# Patient Record
Sex: Female | Born: 1970 | Race: White | Hispanic: No | Marital: Married | Smoking: Never smoker
Health system: Southern US, Community
[De-identification: ages and names within clinical notes are randomized; demographics above are authoritative.]

## PROBLEM LIST (undated history)

## (undated) DIAGNOSIS — M1990 Unspecified osteoarthritis, unspecified site: Secondary | ICD-10-CM

## (undated) DIAGNOSIS — N943 Premenstrual tension syndrome: Secondary | ICD-10-CM

## (undated) DIAGNOSIS — A048 Other specified bacterial intestinal infections: Secondary | ICD-10-CM

## (undated) DIAGNOSIS — D1779 Benign lipomatous neoplasm of other sites: Secondary | ICD-10-CM

## (undated) DIAGNOSIS — F419 Anxiety disorder, unspecified: Secondary | ICD-10-CM

## (undated) DIAGNOSIS — R030 Elevated blood-pressure reading, without diagnosis of hypertension: Secondary | ICD-10-CM

## (undated) DIAGNOSIS — R42 Dizziness and giddiness: Secondary | ICD-10-CM

## (undated) DIAGNOSIS — K219 Gastro-esophageal reflux disease without esophagitis: Secondary | ICD-10-CM

## (undated) DIAGNOSIS — R7303 Prediabetes: Secondary | ICD-10-CM

## (undated) HISTORY — PX: WISDOM TOOTH EXTRACTION: SHX21

---
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
## 2000-06-13 HISTORY — PX: LAPAROTOMY: SHX154

## 2003-02-19 ENCOUNTER — Other Ambulatory Visit: Admission: RE | Admit: 2003-02-19 | Discharge: 2003-02-19 | Payer: Self-pay | Admitting: Obstetrics and Gynecology

## 2003-09-25 ENCOUNTER — Inpatient Hospital Stay (HOSPITAL_COMMUNITY): Admission: AD | Admit: 2003-09-25 | Discharge: 2003-09-29 | Payer: Self-pay | Admitting: Obstetrics and Gynecology

## 2009-06-13 HISTORY — PX: TUMOR EXCISION: SHX421

## 2010-10-27 ENCOUNTER — Ambulatory Visit: Payer: Self-pay | Admitting: Unknown Physician Specialty

## 2010-11-19 ENCOUNTER — Ambulatory Visit: Payer: Self-pay | Admitting: Unknown Physician Specialty

## 2013-12-23 ENCOUNTER — Ambulatory Visit: Payer: Self-pay | Admitting: Obstetrics and Gynecology

## 2013-12-25 ENCOUNTER — Ambulatory Visit: Payer: Self-pay | Admitting: Obstetrics and Gynecology

## 2014-06-13 DIAGNOSIS — A048 Other specified bacterial intestinal infections: Secondary | ICD-10-CM

## 2014-06-13 HISTORY — DX: Other specified bacterial intestinal infections: A04.8

## 2014-07-18 ENCOUNTER — Ambulatory Visit: Payer: Self-pay | Admitting: Obstetrics and Gynecology

## 2014-09-02 ENCOUNTER — Ambulatory Visit: Payer: Self-pay | Admitting: Family Medicine

## 2014-10-29 ENCOUNTER — Encounter: Payer: Self-pay | Admitting: *Deleted

## 2014-10-31 ENCOUNTER — Ambulatory Visit
Admission: RE | Admit: 2014-10-31 | Discharge: 2014-10-31 | Disposition: A | Discharge status: Home / Self Care | Payer: 59 | Source: Ambulatory Visit | Attending: Gastroenterology | Admitting: Gastroenterology

## 2014-10-31 ENCOUNTER — Ambulatory Visit: Payer: 59 | Admitting: Anesthesiology

## 2014-10-31 ENCOUNTER — Encounter
Admission: RE | Disposition: A | Discharge status: Home / Self Care | Payer: Self-pay | Source: Ambulatory Visit | Attending: Gastroenterology

## 2014-10-31 ENCOUNTER — Other Ambulatory Visit: Payer: Self-pay | Admitting: Gastroenterology

## 2014-10-31 DIAGNOSIS — R42 Dizziness and giddiness: Secondary | ICD-10-CM | POA: Diagnosis not present

## 2014-10-31 DIAGNOSIS — K2950 Unspecified chronic gastritis without bleeding: Secondary | ICD-10-CM | POA: Insufficient documentation

## 2014-10-31 DIAGNOSIS — Z882 Allergy status to sulfonamides status: Secondary | ICD-10-CM | POA: Insufficient documentation

## 2014-10-31 DIAGNOSIS — Z9889 Other specified postprocedural states: Secondary | ICD-10-CM | POA: Diagnosis not present

## 2014-10-31 DIAGNOSIS — F419 Anxiety disorder, unspecified: Secondary | ICD-10-CM | POA: Diagnosis not present

## 2014-10-31 DIAGNOSIS — Z79899 Other long term (current) drug therapy: Secondary | ICD-10-CM | POA: Diagnosis not present

## 2014-10-31 DIAGNOSIS — K219 Gastro-esophageal reflux disease without esophagitis: Secondary | ICD-10-CM | POA: Diagnosis present

## 2014-10-31 DIAGNOSIS — B9681 Helicobacter pylori [H. pylori] as the cause of diseases classified elsewhere: Secondary | ICD-10-CM | POA: Insufficient documentation

## 2014-10-31 HISTORY — PX: ESOPHAGOGASTRODUODENOSCOPY: SHX5428

## 2014-10-31 HISTORY — DX: Dizziness and giddiness: R42

## 2014-10-31 HISTORY — DX: Gastro-esophageal reflux disease without esophagitis: K21.9

## 2014-10-31 HISTORY — DX: Premenstrual tension syndrome: N94.3

## 2014-10-31 HISTORY — DX: Anxiety disorder, unspecified: F41.9

## 2014-10-31 SURGERY — EGD (ESOPHAGOGASTRODUODENOSCOPY)
Anesthesia: Monitor Anesthesia Care | Wound class: Clean Contaminated

## 2014-10-31 MED ORDER — LIDOCAINE HCL (CARDIAC) 20 MG/ML IV SOLN
INTRAVENOUS | Status: DC | PRN
  Administered 2014-10-31: 30 mg via INTRAVENOUS

## 2014-10-31 MED ORDER — LACTATED RINGERS IV SOLN
INTRAVENOUS | Status: DC
  Administered 2014-10-31 (×2): via INTRAVENOUS

## 2014-10-31 MED ORDER — ACETAMINOPHEN 325 MG PO TABS: 325.0000 mg | ORAL_TABLET | ORAL | Status: DC | PRN

## 2014-10-31 MED ORDER — FENTANYL CITRATE (PF) 100 MCG/2ML IJ SOLN: 25.0000 ug | INTRAMUSCULAR | Status: DC | PRN

## 2014-10-31 MED ORDER — OXYCODONE HCL 5 MG PO TABS: 5.0000 mg | ORAL_TABLET | Freq: Once | ORAL | Status: DC | PRN

## 2014-10-31 MED ORDER — GLYCOPYRROLATE 0.2 MG/ML IJ SOLN
INTRAMUSCULAR | Status: DC | PRN
  Administered 2014-10-31: 0.1 mg via INTRAVENOUS

## 2014-10-31 MED ORDER — SODIUM CHLORIDE 0.9 % IV SOLN: INTRAVENOUS | Status: DC

## 2014-10-31 MED ORDER — DEXAMETHASONE SODIUM PHOSPHATE 4 MG/ML IJ SOLN: 8.0000 mg | Freq: Once | INTRAMUSCULAR | Status: DC | PRN

## 2014-10-31 MED ORDER — ACETAMINOPHEN 160 MG/5ML PO SOLN: 325.0000 mg | ORAL | Status: DC | PRN

## 2014-10-31 MED ORDER — PROPOFOL 10 MG/ML IV BOLUS
INTRAVENOUS | Status: DC | PRN
  Administered 2014-10-31: 100 mg via INTRAVENOUS
  Administered 2014-10-31: 50 mg via INTRAVENOUS

## 2014-10-31 MED ORDER — OXYCODONE HCL 5 MG/5ML PO SOLN: 5.0000 mg | Freq: Once | ORAL | Status: DC | PRN

## 2014-10-31 SURGICAL SUPPLY — 38 items
BALLN DILATOR 10-12 8 (BALLOONS)
BALLN DILATOR 12-15 8 (BALLOONS)
BALLN DILATOR 15-18 8 (BALLOONS)
BALLN DILATOR CRE 0-12 8 (BALLOONS)
BALLN DILATOR ESOPH 8 10 CRE (MISCELLANEOUS) IMPLANT
BALLOON DILATOR 12-15 8 (BALLOONS) IMPLANT
BALLOON DILATOR 15-18 8 (BALLOONS) IMPLANT
BALLOON DILATOR CRE 0-12 8 (BALLOONS) IMPLANT
BLOCK BITE 60FR ADLT L/F GRN (MISCELLANEOUS) ×2 IMPLANT
CANISTER SUCT 1200ML W/VALVE (MISCELLANEOUS) ×2 IMPLANT
FCP ESCP3.2XJMB 240X2.8X (MISCELLANEOUS)
FORCEPS BIOP RAD 4 LRG CAP 4 (CUTTING FORCEPS) ×2 IMPLANT
FORCEPS BIOP RJ4 240 W/NDL (MISCELLANEOUS)
FORCEPS ESCP3.2XJMB 240X2.8X (MISCELLANEOUS) IMPLANT
GOWN CVR UNV OPN BCK APRN NK (MISCELLANEOUS) ×2 IMPLANT
GOWN ISOL THUMB LOOP REG UNIV (MISCELLANEOUS) ×2
HEMOCLIP INSTINCT (CLIP) IMPLANT
INJECTOR VARIJECT VIN23 (MISCELLANEOUS) IMPLANT
KIT CO2 TUBING (TUBING) IMPLANT
KIT DEFENDO VALVE AND CONN (KITS) IMPLANT
KIT ENDO PROCEDURE OLY (KITS) ×2 IMPLANT
LIGATOR MULTIBAND 6SHOOTER MBL (MISCELLANEOUS) IMPLANT
MARKER SPOT ENDO TATTOO 5ML (MISCELLANEOUS) IMPLANT
PAD GROUND ADULT SPLIT (MISCELLANEOUS) IMPLANT
SNARE SHORT THROW 13M SML OVAL (MISCELLANEOUS) IMPLANT
SNARE SHORT THROW 30M LRG OVAL (MISCELLANEOUS) IMPLANT
SPOT EX ENDOSCOPIC TATTOO (MISCELLANEOUS)
SUCTION POLY TRAP 4CHAMBER (MISCELLANEOUS) IMPLANT
SYR INFLATION 60ML (SYRINGE) IMPLANT
TRAP SUCTION POLY (MISCELLANEOUS) IMPLANT
TUBING CONN 6MMX3.1M (TUBING)
TUBING SUCTION CONN 0.25 STRL (TUBING) IMPLANT
UNDERPAD 30X60 958B10 (PK) (MISCELLANEOUS) IMPLANT
VALVE BIOPSY ENDO (VALVE) IMPLANT
VARIJECT INJECTOR VIN23 (MISCELLANEOUS)
WATER AUXILLARY (MISCELLANEOUS) IMPLANT
WATER STERILE IRR 500ML POUR (IV SOLUTION) ×2 IMPLANT
WIRE CRE 18-20MM 8CM F G (MISCELLANEOUS) IMPLANT

## 2014-10-31 NOTE — Op Note (Signed)
[facility] Patient Name: Lindsay Edwards Procedure Date: 10/31/2014 8:49 AM MRN: [id number] Account #: [id number] Date of Birth: [birth year] Admit Type: Outpatient Age: 44 Room: [facility] OR ROOM 01 Gender: Female Note Status: Finalized Procedure:         Upper GI endoscopy Indications:       Heartburn Providers:         Lucilla Lame, MD Referring MD:      Rose Fillers. Jaynie Crumble, MD (Referring MD) Medicines:         Propofol per Anesthesia Complications:     No immediate complications. Procedure:         Pre-Anesthesia Assessment:                    - Prior to the procedure, a History and Physical was                     performed, and patient medications and allergies were                     reviewed. The patient's tolerance of previous anesthesia                     was also reviewed. The risks and benefits of the procedure                     and the sedation options and risks were discussed with the                     patient. All questions were answered, and informed consent                     was obtained. Prior Anticoagulants: The patient has taken                     no previous anticoagulant or antiplatelet agents. ASA                     Grade Assessment: II - A patient with mild systemic                     disease. After reviewing the risks and benefits, the                     patient was deemed in satisfactory condition to undergo                     the procedure.                    After obtaining informed consent, the endoscope was passed                     under direct vision. Throughout the procedure, the                     patient's blood pressure, pulse, and oxygen saturations                     were monitored continuously. The Olympus GIF H180J                     colonscope (N#:[id number]) was introduced through the mouth,  and advanced to the second part of duodenum. The upper GI                     endoscopy  was accomplished without difficulty. The patient                     tolerated the procedure well. Findings:      The examined esophagus was normal.      Localized mild inflammation was found in the gastric antrum. Biopsies       were taken with a cold forceps for histology.      The examined duodenum was normal. Impression:        - Normal esophagus.                    - Gastritis. Biopsied.                    - Normal examined duodenum. Recommendation:    - Await pathology results.                    - Continue present medications. Procedure Code(s): --- Professional ---                    [phone], Esophagogastroduodenoscopy, flexible, transoral;                     with biopsy, single or multiple Diagnosis Code(s): --- Professional ---                    R12, Heartburn                    K29.70, Gastritis, unspecified, without bleeding CPT copyright 2014 American Medical Association. All rights reserved. The codes documented in this report are preliminary and upon coder review may  be revised to meet current compliance requirements. Lucilla Lame, MD 10/31/2014 9:04:39 AM This report has been signed electronically. Number of Addenda: 0 Note Initiated On: 10/31/2014 8:49 AM Total Procedure Duration: 0 hours 2 minutes 22 seconds       [facility]

## 2014-10-31 NOTE — Transfer of Care (Signed)
Immediate Anesthesia Transfer of Care Note  Patient: Lindsay Edwards  Procedure(s) Performed: Procedure(s): ESOPHAGOGASTRODUODENOSCOPY (EGD) (N/A)  Patient Location: PACU  Anesthesia Type: MAC  Level of Consciousness: awake, alert  and patient cooperative  Airway and Oxygen Therapy: Patient Spontanous Breathing and Patient connected to supplemental oxygen  Post-op Assessment: Post-op Vital signs reviewed, Patient's Cardiovascular Status Stable, Respiratory Function Stable, Patent Airway and No signs of Nausea or vomiting  Post-op Vital Signs: Reviewed and stable  Complications: No apparent anesthesia complications

## 2014-10-31 NOTE — Anesthesia Procedure Notes (Signed)
Procedure Name: MAC Performed by: Justin Buechner Pre-anesthesia Checklist: Patient identified, Emergency Drugs available, Suction available, Patient being monitored and Timeout performed Patient Re-evaluated:Patient Re-evaluated prior to inductionOxygen Delivery Method: Nasal cannula Placement Confirmation: positive ETCO2 and breath sounds checked- equal and bilateral     

## 2014-10-31 NOTE — Anesthesia Postprocedure Evaluation (Signed)
  Anesthesia Post-op Note  Patient: Lindsay Edwards  Procedure(s) Performed: Procedure(s): ESOPHAGOGASTRODUODENOSCOPY (EGD) (N/A)  Anesthesia type:MAC  Patient location: PACU  Post pain: Pain level controlled  Post assessment: Post-op Vital signs reviewed, Patient's Cardiovascular Status Stable, Respiratory Function Stable, Patent Airway and No signs of Nausea or vomiting  Post vital signs: Reviewed and stable  Last Vitals:  Filed Vitals:   10/31/14 0909  BP: 110/79  Pulse: 96  Temp:   Resp: 17    Level of consciousness: awake, alert  and patient cooperative  Complications: No apparent anesthesia complications

## 2014-10-31 NOTE — Anesthesia Preprocedure Evaluation (Addendum)
Anesthesia Evaluation  Patient identified by MRN, date of birth, ID band Patient awake    Reviewed: Allergy & Precautions, H&P , NPO status , Patient's Chart, lab work & pertinent test results, reviewed documented beta blocker date and time   Airway Mallampati: III  TM Distance: <3 FB Neck ROM: full    Dental no notable dental hx.    Pulmonary neg pulmonary ROS,  breath sounds clear to auscultation  Pulmonary exam normal       Cardiovascular Exercise Tolerance: Good negative cardio ROS  Rhythm:regular Rate:Normal     Neuro/Psych PSYCHIATRIC DISORDERS vertigo negative psych ROS   GI/Hepatic Neg liver ROS, GERD-  Medicated,  Endo/Other  negative endocrine ROS  Renal/GU negative Renal ROS  negative genitourinary   Musculoskeletal   Abdominal   Peds  Hematology negative hematology ROS (+)   Anesthesia Other Findings   Reproductive/Obstetrics negative OB ROS                            Anesthesia Physical Anesthesia Plan  ASA: II  Anesthesia Plan: MAC   Post-op Pain Management:    Induction:   Airway Management Planned:   Additional Equipment:   Intra-op Plan:   Post-operative Plan:   Informed Consent: I have reviewed the patients History and Physical, chart, labs and discussed the procedure including the risks, benefits and alternatives for the proposed anesthesia with the patient or authorized representative who has indicated his/her understanding and acceptance.   Dental Advisory Given  Plan Discussed with: CRNA  Anesthesia Plan Comments:         Anesthesia Quick Evaluation

## 2014-10-31 NOTE — H&P (Signed)
  [facility]  [address] [place], [place] [address] Phone: [phone] Fax : [phone]  Primary Care Physician:  No primary care provider on file. Primary Gastroenterologist:  Dr. Allen Norris  Pre-Procedure History & Physical: HPI:  Lindsay Edwards is a 44 y.o. female is here for an endoscopy.   Past Medical History  Diagnosis Date  . Anxiety   . PMS (premenstrual syndrome)   . GERD (gastroesophageal reflux disease)   . Vertigo     couple mos ago - Dr Tami Ribas "fixed the crystals"    Past Surgical History  Procedure Laterality Date  . Cesarean section  2005  . 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 Laparotomy  2002    Prior to Admission medications   Medication Sig Start Date End Date Taking? Authorizing Provider  ALPRAZolam [name]) 0.5 MG tablet Take 0.5 mg by mouth daily as needed for anxiety (1/2 tab every AM).   Yes Historical Provider, MD  calcium carbonate (TUMS - DOSED IN MG ELEMENTAL CALCIUM) 500 MG chewable tablet Chew 1 tablet by mouth as needed for indigestion or heartburn.   Yes Historical Provider, MD  lansoprazole (PREVACID) 30 MG capsule Take 30 mg by mouth daily at 12 noon. AM   Yes Historical Provider, MD    Allergies as of 10/29/2014 - Review Complete 10/29/2014  Allergen Reaction Noted  . Sulfa antibiotics Rash 10/29/2014    History reviewed. No pertinent family history.  History   Social History  . Marital Status: Married    Spouse Name: N/A  . Number of Children: N/A  . Years of Education: N/A   Occupational History  . Not on file.   Social History Main Topics  . Smoking status: Never Smoker   . Smokeless tobacco: Not on file  . Alcohol Use: No  . Drug Use: Not on file  . Sexual Activity: Not on file   Other Topics Concern  . Not on file   Social History Narrative  . No narrative on file    Review of Systems: See HPI, otherwise negative ROS  Physical Exam: BP 121/80 mmHg  Pulse 81  Temp(Src) 98.1 F (36.7  C) (Temporal)  Resp 16  Ht 5\' 2"  (1.575 m)  Wt 132 lb (59.875 kg)  BMI 24.14 kg/m2  SpO2 100%  LMP 10/28/2014 (Exact Date) General:   Alert,  pleasant and cooperative in NAD Head:  Normocephalic and atraumatic. Neck:  Supple; no masses or thyromegaly. Lungs:  Clear throughout to auscultation.    Heart:  Regular rate and rhythm. Abdomen:  Soft, nontender and nondistended. Normal bowel sounds, without guarding, and without rebound.   Neurologic:  Alert and  oriented x4;  grossly normal neurologically.  Impression/Plan: Lindsay Edwards is here for an endoscopy to be performed for GERD  Risks, benefits, limitations, and alternatives regarding  endoscopy have been reviewed with the patient.  Questions have been answered.  All parties agreeable.   [facility], MD  10/31/2014, 8:50 AM

## 2014-11-01 ENCOUNTER — Encounter: Payer: Self-pay | Admitting: Gastroenterology

## 2014-11-13 ENCOUNTER — Telehealth: Payer: Self-pay | Admitting: Gastroenterology

## 2014-11-13 NOTE — Telephone Encounter (Signed)
Patient currently taking Pylera to treat H. Pylori and is having frequent unexpected bowel movements and has had to leave work several times to change clothes. She works at [employer] and needs letter stating that she needs to be excused intermittently during timeframe (5/26-6/5) that she is taking medication so that this does not impact her attendance at work. Letter written per Vickey Huger, NP and will be at front desk for patient to pick up this afternoon. I explained that frequent BM's as well as Abdominal Pain is often a side effect of this medication. Pt verbalizes understanding of all information above.

## 2014-11-13 NOTE — Telephone Encounter (Signed)
Patient is taking medication for H Pylori - she has been to the bathroom 6-7 times just this morning, it is solid but she has been multiple times - patient req call from nurse to advise

## 2014-11-21 ENCOUNTER — Ambulatory Visit (INDEPENDENT_AMBULATORY_CARE_PROVIDER_SITE_OTHER): Payer: 59 | Admitting: Urgent Care

## 2014-11-21 VITALS — BP 127/83 | HR 98 | Temp 98.0°F | Ht 64.0 in | Wt 136.6 lb

## 2014-11-21 DIAGNOSIS — Z09 Encounter for follow-up examination after completed treatment for conditions other than malignant neoplasm: Secondary | ICD-10-CM | POA: Diagnosis not present

## 2014-11-21 DIAGNOSIS — B9681 Helicobacter pylori [H. pylori] as the cause of diseases classified elsewhere: Secondary | ICD-10-CM | POA: Diagnosis not present

## 2014-11-21 DIAGNOSIS — K2960 Other gastritis without bleeding: Secondary | ICD-10-CM

## 2014-11-21 DIAGNOSIS — K2970 Gastritis, unspecified, without bleeding: Secondary | ICD-10-CM

## 2014-11-21 NOTE — Assessment & Plan Note (Signed)
Much improved s/p treatment Please turn in stool sample in 6 weeks.  We will call you with results. STOP PREVACID 2 WEEKS PRIOR TO YOUR STOOL SPECIMEN COLLECTION You may restart afterwards if you need for reflux Call if any problems, otherwise FU with your PCP

## 2014-11-21 NOTE — Progress Notes (Signed)
   Primary Care Physician: No primary care provider on file. Primary Gastroenterologist:  Dr Allen Norris  Chief Complaint  Patient presents with  . Follow-up    H Pylori    HPI: Lindsay Edwards is a 44 y.o. female here for follow up of h pylori.  She completed antibiotic therapy & is feeling 100% better.  She continues prevacid 30mg  daily for heartburn. EGD with Dr Allen Norris showed h pylori gastritis.  Denies N/D/V.  Current Outpatient Prescriptions  Medication Sig Dispense Refill  . ALPRAZolam (XANAX) 0.5 MG tablet Take 0.5 mg by mouth daily as needed for anxiety (1/2 tab every AM).    . calcium carbonate (TUMS - DOSED IN MG ELEMENTAL CALCIUM) 500 MG chewable tablet Chew 1 tablet by mouth as needed for indigestion or heartburn.    . lansoprazole (PREVACID) 30 MG capsule Take 30 mg by mouth daily at 12 noon. AM     No current facility-administered medications for this visit.    Allergies as of 11/21/2014 - Review Complete 11/21/2014  Allergen Reaction Noted  . Sulfa antibiotics Rash 10/29/2014    Review of Systems: Gen: Denies any fever, chills, fatigue, weakness, malaise ENT: Negative for hoarseness, difficulty swallowing , nasal congestion CV: Denies chest pain, angina, palpitations, syncope, orthopnea, PND, peripheral edema, and claudication. Resp: Denies dyspnea at rest, dyspnea with exercise, cough, sputum, wheezing, coughing up blood, and pleurisy. GI: See HPI GU:  Negative for dysuria, hematuria, urinary incontinence, urinary frequency, nocturnal urination.  Endo: Negative for unusual weight change or sweats Derm: Denies jaundice, rash, itching, or unhealing ulcers.  Psych: Denies depression, anxiety, memory loss, suicidal ideation, hallucinations, paranoia, and confusion. Heme: Denies bruising, bleeding, and enlarged lymph nodes.   Physical Examination:  BP 127/83 mmHg  Pulse 98  Temp(Src) 98 F (36.7 C) (Oral)  Ht 5\' 4"  (1.626 m)  Wt 136 lb 9.6 oz (61.961 kg)  BMI 23.44  kg/m2  LMP 10/28/2014 (Exact Date) Body mass index is 23.44 kg/(m^2). Patient's last menstrual period was 10/28/2014 (exact date). General:   Alert,  Well-developed, well-nourished, pleasant and cooperative in NAD Head:  Normocephalic and atraumatic. Eyes:  Sclera clear, no icterus.   Conjunctiva pink. Mouth:  No deformity or lesions.  Oropharynx pink & moist. Neck:  Supple; no masses or thyromegaly. Heart:  Regular rate and rhythm; no murmurs, clicks, rubs,  or gallops. Abdomen:   Normal bowel sounds.  Soft, nontender and nondistended. No masses, hepatosplenomegaly or hernias noted. No guarding or rebound tenderness.   Msk:  Symmetrical without gross deformities. Normal posture. Pulses:  Normal pulses noted. Extremities:  Without clubbing or edema. Neurologic:  Alert and  oriented x3;  grossly normal neurologically. Skin:  Intact without significant lesions or rashes. Cervical Nodes:  No significant cervical adenopathy. Psych:  Alert and cooperative. Normal mood and affect.

## 2014-11-21 NOTE — Patient Instructions (Signed)
Please turn in stool sample in 6 weeks.  We will call you with results. STOP PREVACID 2 WEEKS PRIOR TO YOUR STOOL SPECIMEN COLLECTION You may restart afterwards if you need for reflux Call if any problems, otherwise FU with your PCP   Gastritis, Adult Gastritis is soreness and swelling (inflammation) of the lining of the stomach. Gastritis can develop as a sudden onset (acute) or long-term (chronic) condition. If gastritis is not treated, it can lead to stomach bleeding and ulcers. CAUSES  Gastritis occurs when the stomach lining is weak or damaged. Digestive juices from the stomach then inflame the weakened stomach lining. The stomach lining may be weak or damaged due to viral or bacterial infections. One common bacterial infection is the Helicobacter pylori infection. Gastritis can also result from excessive alcohol consumption, taking certain medicines, or having too much acid in the stomach.  SYMPTOMS  In some cases, there are no symptoms. When symptoms are present, they may include:  Pain or a burning sensation in the upper abdomen.  Nausea.  Vomiting.  An uncomfortable feeling of fullness after eating. DIAGNOSIS  Your caregiver may suspect you have gastritis based on your symptoms and a physical exam. To determine the cause of your gastritis, your caregiver may perform the following:  Blood or stool tests to check for the H pylori bacterium.  Gastroscopy. A thin, flexible tube (endoscope) is passed down the esophagus and into the stomach. The endoscope has a light and camera on the end. Your caregiver uses the endoscope to view the inside of the stomach.  Taking a tissue sample (biopsy) from the stomach to examine under a microscope. TREATMENT  Depending on the cause of your gastritis, medicines may be prescribed. If you have a bacterial infection, such as an H pylori infection, antibiotics may be given. If your gastritis is caused by too much acid in the stomach, H2 blockers or  antacids may be given. Your caregiver may recommend that you stop taking aspirin, ibuprofen, or other nonsteroidal anti-inflammatory drugs (NSAIDs). HOME CARE INSTRUCTIONS  Only take over-the-counter or prescription medicines as directed by your caregiver.  If you were given antibiotic medicines, take them as directed. Finish them even if you start to feel better.  Drink enough fluids to keep your urine clear or pale yellow.  Avoid foods and drinks that make your symptoms worse, such as:  Caffeine or alcoholic drinks.  Chocolate.  Peppermint or mint flavorings.  Garlic and onions.  Spicy foods.  Citrus fruits, such as oranges, lemons, or limes.  Tomato-based foods such as sauce, chili, salsa, and pizza.  Fried and fatty foods.  Eat small, frequent meals instead of large meals. SEEK IMMEDIATE MEDICAL CARE IF:   You have black or dark red stools.  You vomit blood or material that looks like coffee grounds.  You are unable to keep fluids down.  Your abdominal pain gets worse.  You have a fever.  You do not feel better after 1 week.  You have any other questions or concerns. MAKE SURE YOU:  Understand these instructions.  Will watch your condition.  Will get help right away if you are not doing well or get worse. Document Released: 05/24/2001 Document Revised: 11/29/2011 Document Reviewed: 07/13/2011 [facility] Patient Information 2015 [place], [place]. This information is not intended to replace advice given to you by your health care provider. Make sure you discuss any questions you have with your health care provider.

## 2014-12-22 ENCOUNTER — Other Ambulatory Visit: Payer: Self-pay

## 2014-12-23 ENCOUNTER — Encounter: Payer: Self-pay | Admitting: Family Medicine

## 2014-12-23 ENCOUNTER — Ambulatory Visit (INDEPENDENT_AMBULATORY_CARE_PROVIDER_SITE_OTHER): Payer: 59 | Admitting: Family Medicine

## 2014-12-23 VITALS — BP 104/80 | HR 84 | Temp 98.2°F | Resp 16 | Wt 135.0 lb

## 2014-12-23 DIAGNOSIS — J069 Acute upper respiratory infection, unspecified: Secondary | ICD-10-CM

## 2014-12-23 LAB — POCT RAPID STREP A (OFFICE): Rapid Strep A Screen: NEGATIVE

## 2014-12-23 NOTE — Patient Instructions (Signed)
Discussed use of Mucinex D for congestion, Delsym for cough, and Benadryl for postnasal drainage 

## 2014-12-23 NOTE — Progress Notes (Signed)
Subjective:     Patient ID: Lindsay Edwards, female   DOB: 15-Feb-1971, 44 y.o.   MRN: [id number]  HPI  Chief Complaint  Patient presents with  . Sore Throat    Patient comes in office today with concerns of dry scrathy throat and right ear pain for the past 24-28hrs, patient reports that she has been exposed to strep  Reports she has completed a course of treatment for a H.Pylori infection.   Review of Systems  Constitutional: Negative for fever and chills.       Objective:   Physical Exam Ears: T.M's intact without inflammation Throat: mild tonsillar enlargement/ no exudate Neck: tender bilateral anterior cervical nodes Lungs: clear    Assessment:    1. Upper respiratory infection - POCT rapid strep A    Plan:    Discussed use of Mucinex D for congestion, Delsym for cough, and Benadryl for postnasal drainage

## 2014-12-24 LAB — H. PYLORI ANTIGEN, STOOL: H pylori Ag, Stl: NEGATIVE

## 2014-12-25 ENCOUNTER — Telehealth: Payer: Self-pay | Admitting: Urgent Care

## 2014-12-25 NOTE — Telephone Encounter (Signed)
Pt has called and is stating that she having severe acid reflux that is causing chest pains (pressure and burning sensation). She also states that she feels very nauseous, lack of appetite and throat is very dry. Pt turned in stool sample on 12/22/14 at [facility] for H. Pylori. She would like results.  Pt is currently taking prevacid.

## 2014-12-25 NOTE — Telephone Encounter (Signed)
Spoke with patient at this time.  Symptoms started on Monday am. First Prevacid since break was taken on Sunday. Explained to patient that it may take up to 2 weeks to help with GERD symptoms. Pt also states that she was taking Mucinex at the time that symptoms began. Patient encouraged to call back with worsening symptoms or continued symptoms past the 14 days since restarting prevacid.  Still awaiting results of H. Pylori from [facility]. Will call patient with results when these arrive.

## 2014-12-26 ENCOUNTER — Telehealth: Payer: Self-pay | Admitting: Family Medicine

## 2014-12-26 NOTE — Telephone Encounter (Signed)
Pt states Lindsay Edwards advised her to state taking Mucinex D and Delsym but pt had to stop taking both of these because of her acid reflux.  Pt states now she is not taking anything for the cough and congestion.  Pt is requesting a z-pack or something that will help with the cough and congestion.  [employer].  [phone]

## 2014-12-26 NOTE — Telephone Encounter (Signed)
Please advise 

## 2014-12-26 NOTE — Telephone Encounter (Signed)
[id number] 

## 2014-12-26 NOTE — Telephone Encounter (Signed)
Antibiotics will not help with this viral infection. Do you want a cough syrup with codeine or hydrocodone?

## 2014-12-29 ENCOUNTER — Telehealth: Payer: Self-pay | Admitting: Family Medicine

## 2014-12-29 ENCOUNTER — Other Ambulatory Visit: Payer: Self-pay | Admitting: Family Medicine

## 2014-12-29 DIAGNOSIS — K219 Gastro-esophageal reflux disease without esophagitis: Secondary | ICD-10-CM

## 2014-12-29 MED ORDER — SUCRALFATE 1 G PO TABS: 1.0000 g | ORAL_TABLET | Freq: Three times a day (TID) | ORAL | Status: DC

## 2014-12-29 NOTE — Telephone Encounter (Signed)
[id number] 

## 2014-12-29 NOTE — Telephone Encounter (Signed)
Left message to try Carafate (sent In) to ameliorate her GERD sx while URI is abating.

## 2014-12-29 NOTE — Telephone Encounter (Signed)
Patient states that she is coughing up chunks of yellow phlegm she states that she was taking Mucinex D and Delsym and her acid reflux got so bad she called out of work. When she called her specialist they told her to d/c Mucinex D and Delsym as it will make reflux worse. She states that she has a cough syrup at home with codeine and that she does not want another cough syrup. She is asking that you prescribe antibiotic. Please Advise. KW

## 2014-12-29 NOTE — Telephone Encounter (Signed)
Pt says the medication she was given gave her reflux really bad.  She wants an antibiotic.  She uses [facility],   She said she called Friday but didn't get called back.

## 2014-12-30 ENCOUNTER — Telehealth: Payer: Self-pay

## 2014-12-30 MED ORDER — LANSOPRAZOLE 30 MG PO CPDR: 30.0000 mg | DELAYED_RELEASE_CAPSULE | Freq: Every day | ORAL | Status: DC

## 2014-12-30 NOTE — Telephone Encounter (Signed)
Called to give patient negative H. Pylori result.  States that she is feeling much better. Refills for prevacid sent to pharmacy.  Pt will not need an appointment until 1 year unless symptoms arise or worsen.  Pt verbalized understanding of all in instructions above.

## 2014-12-30 NOTE — Telephone Encounter (Signed)
Provider has spoken with patient today and she has been notified. KW

## 2015-01-20 ENCOUNTER — Telehealth: Payer: Self-pay | Admitting: Gastroenterology

## 2015-01-20 NOTE — Telephone Encounter (Signed)
Spoke with patient at this time. She states that she has been having increased anxiety for the last 2 weeks and has had 2 episodes in the last 2 days where she has felt like her heartburn is worse, and has a numbness sensation that goes to the tips of both arms. States that she is able to talk herself down out of these episodes.  Was started on Celexa last night and was told my PCP that this will take some time to work. Offered reassurance and explained that it was ok to take a Tums or an extra prevacid periodically if needed but not as a daily event.

## 2015-01-20 NOTE — Telephone Encounter (Signed)
Pt has called and states her symptoms have returned that is causing burning sensation is chest, radiates to neck and arms. Pt is taking prevacid, xanax for nervous bc of symptoms. Pt is at home today due to symptoms.

## 2015-01-23 ENCOUNTER — Telehealth: Payer: Self-pay | Admitting: Gastroenterology

## 2015-01-23 NOTE — Telephone Encounter (Signed)
Spoke with representative from [employer] at this time. Clarified all questions that she had regarding disability. She will call back if she needs anything further.

## 2015-01-23 NOTE — Telephone Encounter (Signed)
Please call Lorriane Shire with [employer] at [phone]. She wanted to talk about some updated disability paperwork she received on patient. Thanks.

## 2015-04-01 ENCOUNTER — Ambulatory Visit (INDEPENDENT_AMBULATORY_CARE_PROVIDER_SITE_OTHER): Payer: 59 | Admitting: Family Medicine

## 2015-04-01 ENCOUNTER — Other Ambulatory Visit: Payer: Self-pay | Admitting: Family Medicine

## 2015-04-01 ENCOUNTER — Encounter: Payer: Self-pay | Admitting: Family Medicine

## 2015-04-01 VITALS — BP 104/72 | HR 64 | Temp 97.9°F | Resp 16 | Wt 139.0 lb

## 2015-04-01 DIAGNOSIS — M545 Low back pain, unspecified: Secondary | ICD-10-CM

## 2015-04-01 DIAGNOSIS — N3090 Cystitis, unspecified without hematuria: Secondary | ICD-10-CM

## 2015-04-01 DIAGNOSIS — J301 Allergic rhinitis due to pollen: Secondary | ICD-10-CM

## 2015-04-01 DIAGNOSIS — J309 Allergic rhinitis, unspecified: Secondary | ICD-10-CM | POA: Insufficient documentation

## 2015-04-01 LAB — POCT URINALYSIS DIPSTICK
BILIRUBIN UA: NEGATIVE
GLUCOSE UA: NEGATIVE
Ketones, UA: NEGATIVE
LEUKOCYTES UA: NEGATIVE
NITRITE UA: NEGATIVE
Protein, UA: NEGATIVE
Spec Grav, UA: <=1.005
UROBILINOGEN UA: NEGATIVE
pH, UA: 7

## 2015-04-01 MED ORDER — NITROFURANTOIN MONOHYD MACRO 100 MG PO CAPS: 100.0000 mg | ORAL_CAPSULE | Freq: Two times a day (BID) | ORAL | Status: DC

## 2015-04-01 NOTE — Progress Notes (Signed)
Subjective:     Patient ID: Lindsay Edwards, female   DOB: 05/29/1971, 44 y.o.   MRN: [id number]  HPI  Chief Complaint  Patient presents with  . Back Pain    Patient comes in office today with complaints of lower back pain on the right side intermittent for the past 4 days. Today patient states that she began to have vaginal pressure when urinating and frequency.   States it has been years since she had a uti. Reports her menses have just started.   Review of Systems  Constitutional: Negative for fever and chills.  Genitourinary: Positive for frequency.       Objective:   Physical Exam  Constitutional: She appears well-developed and well-nourished. No distress.  Genitourinary:  No cva tenderness  Musculoskeletal:  Muscle strength in lower extremities 5/5. SLR's to 90 degrees without radiation of back pain. Mildly tender in her right paravertebral lumbar area.       Assessment:    1. Right-sided low back pain without sciatica - POCT urinalysis dipstick  2. Cystitis - Urine culture - nitrofurantoin, macrocrystal-monohydrate, (MACROBID) 100 MG capsule; Take 1 capsule (100 mg total) by mouth 2 (two) times daily.  Dispense: 14 capsule; Refill: 0    Plan:    Further f/u pending culture result.

## 2015-04-01 NOTE — Patient Instructions (Signed)
We will call you with the urine culture results. 

## 2015-04-03 LAB — URINE CULTURE

## 2015-04-03 LAB — PLEASE NOTE

## 2015-04-09 ENCOUNTER — Other Ambulatory Visit: Payer: Self-pay | Admitting: Obstetrics and Gynecology

## 2015-04-09 DIAGNOSIS — N63 Unspecified lump in unspecified breast: Secondary | ICD-10-CM

## 2015-04-13 ENCOUNTER — Telehealth: Payer: Self-pay

## 2015-04-13 NOTE — Telephone Encounter (Signed)
-----   Message from Carmon Ginsberg, [place] sent at 04/11/2015 10:12 AM EDT ----- Your urine grew out a strep species. Are you better on nitrofurantion?

## 2015-04-13 NOTE — Telephone Encounter (Signed)
LMTCB  Thanks,  -Dalylah Ramey 

## 2015-04-14 NOTE — Telephone Encounter (Signed)
Pt is returning call.  CB# [id number]

## 2015-04-14 NOTE — Telephone Encounter (Signed)
Patient has been advised and states that she is feeling much better, patient advised to complete antibiotic as prescribed. KW

## 2015-04-20 ENCOUNTER — Other Ambulatory Visit: Payer: Self-pay | Admitting: Obstetrics and Gynecology

## 2015-04-20 DIAGNOSIS — N63 Unspecified lump in unspecified breast: Secondary | ICD-10-CM

## 2015-05-15 ENCOUNTER — Other Ambulatory Visit: Payer: Self-pay | Admitting: Obstetrics and Gynecology

## 2015-05-15 ENCOUNTER — Ambulatory Visit
Admission: RE | Admit: 2015-05-15 | Discharge: 2015-05-15 | Disposition: A | Discharge status: Home / Self Care | Payer: 59 | Source: Ambulatory Visit | Attending: Obstetrics and Gynecology | Admitting: Obstetrics and Gynecology

## 2015-05-15 DIAGNOSIS — N63 Unspecified lump in unspecified breast: Secondary | ICD-10-CM

## 2015-08-19 ENCOUNTER — Encounter: Payer: Self-pay | Admitting: Family Medicine

## 2015-08-19 ENCOUNTER — Ambulatory Visit (INDEPENDENT_AMBULATORY_CARE_PROVIDER_SITE_OTHER): Payer: 59 | Admitting: Family Medicine

## 2015-08-19 VITALS — BP 116/80 | HR 104 | Temp 98.4°F | Resp 16 | Wt 148.0 lb

## 2015-08-19 DIAGNOSIS — J069 Acute upper respiratory infection, unspecified: Secondary | ICD-10-CM | POA: Diagnosis not present

## 2015-08-19 DIAGNOSIS — F419 Anxiety disorder, unspecified: Secondary | ICD-10-CM | POA: Insufficient documentation

## 2015-08-19 NOTE — Patient Instructions (Signed)
Discussed use of Benadryl at night and Delsym for cough. Continue Dayquil.

## 2015-08-19 NOTE — Progress Notes (Signed)
Subjective:     Patient ID: Lindsay Edwards, female   DOB: 1971-01-23, 45 y.o.   MRN: [id number]  HPI  Chief Complaint  Patient presents with  . Cough    Patient comes in office today with concerns of cough and congestion since 08/17/15. Patient states that she has noticed pain in her left ear and watering of her eyes, she denies itching or discharge from eye. Patient reports that she has taken otc Dayquil and Ibuprofen for relief.   States husband has been sick as well.   Review of Systems  Constitutional: Negative for fever and chills.       Objective:   Physical Exam  Constitutional: She appears well-developed and well-nourished. No distress.  Ears: T.M's intact without inflammation Sinuses: non-tender Throat: no tonsillar enlargement or exudate Neck: no cervical adenopathy Lungs: clear     Assessment:    1. Upper respiratory infection    Plan:    Discussed continued otc treatment.

## 2015-08-24 ENCOUNTER — Telehealth: Payer: Self-pay | Admitting: Family Medicine

## 2015-08-24 ENCOUNTER — Other Ambulatory Visit: Payer: Self-pay | Admitting: Family Medicine

## 2015-08-24 DIAGNOSIS — J019 Acute sinusitis, unspecified: Secondary | ICD-10-CM

## 2015-08-24 MED ORDER — AMOXICILLIN-POT CLAVULANATE 875-125 MG PO TABS: 1.0000 | ORAL_TABLET | Freq: Two times a day (BID) | ORAL | Status: DC

## 2015-08-24 NOTE — Telephone Encounter (Signed)
Spoke with patient on phone, she states that you had told her that if symptoms did not improve than to call back by Monday.Patient reports sinus pressure and yellow drainage that started again Saturday night, cough is gradually improving but patients wants to know if medication can be called in? Please advise KW

## 2015-08-24 NOTE — Telephone Encounter (Signed)
Antibiotic has been sent in to cover for possible sinus infection

## 2015-08-24 NOTE — Telephone Encounter (Signed)
Pt was in last week and she is still coughing up a lot of stuff, congested and she said you told her if she was not any better to call back and you would call in an antibiotic.  She uses [facility] in [place]

## 2015-08-24 NOTE — Telephone Encounter (Signed)
Please review and advise. KW 

## 2015-08-24 NOTE — Telephone Encounter (Signed)
Please ask her if her sinuses are clear. That should be getting better 7 days out. It takes another week for cough to improve.

## 2015-08-24 NOTE — Telephone Encounter (Signed)
Patient advised.

## 2016-04-22 ENCOUNTER — Ambulatory Visit: Payer: 59 | Admitting: Family Medicine

## 2016-06-13 HISTORY — PX: BUNIONECTOMY: SHX129

## 2016-06-27 DIAGNOSIS — H8112 Benign paroxysmal vertigo, left ear: Secondary | ICD-10-CM | POA: Diagnosis not present

## 2016-06-27 DIAGNOSIS — R42 Dizziness and giddiness: Secondary | ICD-10-CM | POA: Diagnosis not present

## 2016-07-05 ENCOUNTER — Other Ambulatory Visit: Payer: Self-pay | Admitting: Obstetrics and Gynecology

## 2016-07-05 DIAGNOSIS — D241 Benign neoplasm of right breast: Secondary | ICD-10-CM

## 2016-07-27 DIAGNOSIS — D519 Vitamin B12 deficiency anemia, unspecified: Secondary | ICD-10-CM | POA: Diagnosis not present

## 2016-07-28 DIAGNOSIS — L509 Urticaria, unspecified: Secondary | ICD-10-CM | POA: Diagnosis not present

## 2016-07-28 DIAGNOSIS — L309 Dermatitis, unspecified: Secondary | ICD-10-CM | POA: Diagnosis not present

## 2016-07-28 DIAGNOSIS — L503 Dermatographic urticaria: Secondary | ICD-10-CM | POA: Diagnosis not present

## 2016-07-29 ENCOUNTER — Encounter: Payer: Self-pay | Admitting: Radiology

## 2016-07-29 ENCOUNTER — Ambulatory Visit
Admission: RE | Admit: 2016-07-29 | Discharge: 2016-07-29 | Disposition: A | Discharge status: Home / Self Care | Payer: 59 | Source: Ambulatory Visit | Attending: Obstetrics and Gynecology | Admitting: Obstetrics and Gynecology

## 2016-07-29 DIAGNOSIS — D241 Benign neoplasm of right breast: Secondary | ICD-10-CM

## 2016-07-29 DIAGNOSIS — R928 Other abnormal and inconclusive findings on diagnostic imaging of breast: Secondary | ICD-10-CM | POA: Diagnosis not present

## 2016-07-29 DIAGNOSIS — N6489 Other specified disorders of breast: Secondary | ICD-10-CM | POA: Diagnosis not present

## 2016-11-21 ENCOUNTER — Ambulatory Visit: Payer: Self-pay | Admitting: Podiatry

## 2016-11-30 ENCOUNTER — Ambulatory Visit (INDEPENDENT_AMBULATORY_CARE_PROVIDER_SITE_OTHER): Payer: 59 | Admitting: Podiatry

## 2016-11-30 ENCOUNTER — Ambulatory Visit (INDEPENDENT_AMBULATORY_CARE_PROVIDER_SITE_OTHER): Payer: 59

## 2016-11-30 ENCOUNTER — Encounter: Payer: Self-pay | Admitting: Podiatry

## 2016-11-30 VITALS — BP 131/76 | HR 82 | Resp 16

## 2016-11-30 DIAGNOSIS — M2011 Hallux valgus (acquired), right foot: Secondary | ICD-10-CM

## 2016-11-30 DIAGNOSIS — M2010 Hallux valgus (acquired), unspecified foot: Secondary | ICD-10-CM | POA: Diagnosis not present

## 2016-11-30 NOTE — Progress Notes (Signed)
   Subjective:    Patient ID: Lindsay Edwards, female    DOB: [birth year], 46 y.o.   MRN: [id number]  HPI: She presents today with a chief complaint of pain to the first and second toe of the right foot. Since then taken for the past several months most of the pain is in the toe itself tends to get red and swollen with shoes and is uncomfortable. She denies any trauma.    Review of Systems  Musculoskeletal: Positive for arthralgias.  All other systems reviewed and are negative.      Objective:   Physical Exam: Vital signs are stable she is alert and oriented 3. Pulses are palpable. Neurologic sensorium is intact. Deep tendon reflexes are intact. Muscle strength is 5 over 5 dorsiflexion plantar flexors and inverters everters all of his musculature is intact. Orthopedic evaluation demonstrates moderate severe hallux abductovalgus deformity with hallux abductus and early dislocation. She also has a contracted second digit with rigidity at the PIPJ. It appears to be no dislocation of the joint yet. Radiographs confirm an increase in the first intermetatarsal angle greater than normal value and a hallux abductus angle greater than normal value. 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 No open lesions or wounds.        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  Plan: Discussed etiology pathology inserter versus surgical therapy since her amount of time discussing surgical intervention today which she is amendable to we discussed an [facility] bunion repair second metatarsal osteotomy and hammertoe repair she understands this is amenable to it. She will follow up with me in the future for surgical consult regarding these.

## 2016-12-05 ENCOUNTER — Telehealth: Payer: Self-pay | Admitting: *Deleted

## 2016-12-05 NOTE — Telephone Encounter (Signed)
"  I'm calling to schedule my wife's surgery."  Do you have a date in mind?  "We'd like to do it the first Friday in August, I believe the 4th."  August 3 is available.  Someone from the surgical center will call with the arrival time a day or two before surgery date.  She can register now with the surgical center, instructions are in the brochure from the surgical center located in her bag.

## 2016-12-19 DIAGNOSIS — R21 Rash and other nonspecific skin eruption: Secondary | ICD-10-CM | POA: Diagnosis not present

## 2016-12-19 DIAGNOSIS — L508 Other urticaria: Secondary | ICD-10-CM | POA: Diagnosis not present

## 2016-12-19 DIAGNOSIS — L239 Allergic contact dermatitis, unspecified cause: Secondary | ICD-10-CM | POA: Diagnosis not present

## 2016-12-19 DIAGNOSIS — L308 Other specified dermatitis: Secondary | ICD-10-CM | POA: Diagnosis not present

## 2016-12-21 DIAGNOSIS — L239 Allergic contact dermatitis, unspecified cause: Secondary | ICD-10-CM | POA: Diagnosis not present

## 2016-12-26 DIAGNOSIS — L239 Allergic contact dermatitis, unspecified cause: Secondary | ICD-10-CM | POA: Diagnosis not present

## 2017-01-03 DIAGNOSIS — M2011 Hallux valgus (acquired), right foot: Secondary | ICD-10-CM

## 2017-01-11 ENCOUNTER — Other Ambulatory Visit: Payer: Self-pay | Admitting: Podiatry

## 2017-01-11 MED ORDER — HYDROMORPHONE HCL 4 MG PO TABS: 4.0000 mg | ORAL_TABLET | ORAL | 0 refills | Status: DC | PRN

## 2017-01-11 MED ORDER — CLINDAMYCIN HCL 150 MG PO CAPS: 150.0000 mg | ORAL_CAPSULE | Freq: Three times a day (TID) | ORAL | 0 refills | Status: DC

## 2017-01-11 MED ORDER — PROMETHAZINE HCL 25 MG PO TABS: 25.0000 mg | ORAL_TABLET | Freq: Three times a day (TID) | ORAL | 0 refills | Status: DC | PRN

## 2017-01-13 ENCOUNTER — Encounter: Payer: Self-pay | Admitting: Podiatry

## 2017-01-13 DIAGNOSIS — M25571 Pain in right ankle and joints of right foot: Secondary | ICD-10-CM | POA: Diagnosis not present

## 2017-01-13 DIAGNOSIS — M79671 Pain in right foot: Secondary | ICD-10-CM | POA: Diagnosis not present

## 2017-01-13 DIAGNOSIS — M2011 Hallux valgus (acquired), right foot: Secondary | ICD-10-CM | POA: Diagnosis not present

## 2017-01-13 DIAGNOSIS — M21611 Bunion of right foot: Secondary | ICD-10-CM | POA: Diagnosis not present

## 2017-01-16 ENCOUNTER — Telehealth: Payer: Self-pay | Admitting: *Deleted

## 2017-01-16 NOTE — Telephone Encounter (Signed)
"  I'm calling regarding her disability.  I'm calling to verify if she had surgery or not."  She had surgery on 01/13/2017.  "Okay, that's all I needed."

## 2017-01-18 ENCOUNTER — Ambulatory Visit (INDEPENDENT_AMBULATORY_CARE_PROVIDER_SITE_OTHER): Payer: Self-pay | Admitting: Podiatry

## 2017-01-18 ENCOUNTER — Ambulatory Visit (INDEPENDENT_AMBULATORY_CARE_PROVIDER_SITE_OTHER): Payer: 59

## 2017-01-18 ENCOUNTER — Encounter: Payer: Self-pay | Admitting: Podiatry

## 2017-01-18 DIAGNOSIS — M2011 Hallux valgus (acquired), right foot: Secondary | ICD-10-CM

## 2017-01-18 NOTE — Progress Notes (Signed)
She presents today for her first postop visit. She states the pain has not been too bad at all date of surgery 01/13/2017 status post [facility] bunion repair right foot states that it feels pretty good. She presents today with her husband and daughter ambulating in a Dispensing optician.  Objective: Vital signs are stable alert and oriented 3 cam walker intact once removed demonstrates dry sterile dressing intact once removed demonstrates mild edema with ecchymosis good range of motion of the first metatarsophalangeal joint margins well coapted. Radiographs taken today demonstrate screw is in good position and the capital fragment is in good position.  Assessment: Well-healing surgical for status post one week.  Plan: Redress today does compressive dressing encouraged range of motion exercises to the toes. Recommended she continue the use of the cam walker will follow up with her in 1 week at which time we had to be able to remove the sutures and put her in a Darco shoe with compression anklet.

## 2017-01-24 ENCOUNTER — Ambulatory Visit (INDEPENDENT_AMBULATORY_CARE_PROVIDER_SITE_OTHER): Payer: 59 | Admitting: Podiatry

## 2017-01-24 ENCOUNTER — Encounter: Payer: Self-pay | Admitting: Podiatry

## 2017-01-24 DIAGNOSIS — M2011 Hallux valgus (acquired), right foot: Secondary | ICD-10-CM | POA: Diagnosis not present

## 2017-01-24 NOTE — Progress Notes (Signed)
She presents today nearly 2 weeks status post [facility] bunion repair right foot. She states that is still quite tender. Sutures are itching. She denies fever chills nausea vomiting muscle aches and pains. She denies chest pain Pain.  Objective: Vital signs are stable alert and oriented 3 mild edema to the right foot. Sutures are intact moist well coapted great range of motion first metatarsophalangeal joint. He is mildly tender to her.  Assessment: Well-healed surgical foot right.  Plan: Put her in a compression anklet today in the Darco shoe will follow up with her in 2 weeks

## 2017-01-30 ENCOUNTER — Encounter: Payer: 59 | Admitting: Podiatry

## 2017-02-08 ENCOUNTER — Ambulatory Visit (INDEPENDENT_AMBULATORY_CARE_PROVIDER_SITE_OTHER): Payer: 59 | Admitting: Podiatry

## 2017-02-08 ENCOUNTER — Ambulatory Visit (INDEPENDENT_AMBULATORY_CARE_PROVIDER_SITE_OTHER): Payer: 59

## 2017-02-08 DIAGNOSIS — M2011 Hallux valgus (acquired), right foot: Secondary | ICD-10-CM

## 2017-02-08 NOTE — Progress Notes (Signed)
She presents today with her husband states that her foot is doing absolutely great date of surgery was 01/13/2017.  Objective: Vital signs are stable she is alert and oriented 3. Pulses are palpable she has great range of motion of the first metatarsophalangeal joint with mild edema to the medial aspect of the joint. Radiographs demonstrate well-healing surgical foot.  Assessment: Well-healing surgical foot.  Plan: We'll allow her to try to get back into regular tennis shoe follow-up with me in 1 month.

## 2017-02-10 NOTE — Progress Notes (Signed)
DOS 01/13/17 [facility] bunionectomy w screw Rt foot

## 2017-02-21 DIAGNOSIS — Z01419 Encounter for gynecological examination (general) (routine) without abnormal findings: Secondary | ICD-10-CM | POA: Diagnosis not present

## 2017-03-06 ENCOUNTER — Ambulatory Visit (INDEPENDENT_AMBULATORY_CARE_PROVIDER_SITE_OTHER): Payer: 59 | Admitting: Podiatry

## 2017-03-06 ENCOUNTER — Encounter: Payer: Self-pay | Admitting: Podiatry

## 2017-03-06 ENCOUNTER — Ambulatory Visit (INDEPENDENT_AMBULATORY_CARE_PROVIDER_SITE_OTHER): Payer: 59

## 2017-03-06 DIAGNOSIS — M2011 Hallux valgus (acquired), right foot: Secondary | ICD-10-CM

## 2017-03-06 NOTE — Progress Notes (Signed)
She presents today for follow-up of her Lindsay Edwards bunionectomy right foot she states is doing good and no problems with him some activities calluses are starting to come off.  Objective: Vital signs are stable she is alert and oriented 3. Pulses are palpable. No erythema cellulitis drainage or odor moderate edema to the first metatarsal space. This is confirmed on radiographs. She still has some bone healing to go on radiographs it appears to be healing quite nicely at this point.  Assessment: Well-healing surgical foot status post 2 months date of surgery 01/13/2017.  Plan: We'll allow her to get back to work Monday and I will follow-up with her in 1 month. She should questions or concerns or pain she is in facet medially.

## 2017-06-08 DIAGNOSIS — J069 Acute upper respiratory infection, unspecified: Secondary | ICD-10-CM | POA: Diagnosis not present

## 2017-06-19 DIAGNOSIS — M9905 Segmental and somatic dysfunction of pelvic region: Secondary | ICD-10-CM | POA: Diagnosis not present

## 2017-06-19 DIAGNOSIS — M9903 Segmental and somatic dysfunction of lumbar region: Secondary | ICD-10-CM | POA: Diagnosis not present

## 2017-06-19 DIAGNOSIS — M5431 Sciatica, right side: Secondary | ICD-10-CM | POA: Diagnosis not present

## 2017-06-20 DIAGNOSIS — M9903 Segmental and somatic dysfunction of lumbar region: Secondary | ICD-10-CM | POA: Diagnosis not present

## 2017-06-20 DIAGNOSIS — M5431 Sciatica, right side: Secondary | ICD-10-CM | POA: Diagnosis not present

## 2017-06-20 DIAGNOSIS — M9905 Segmental and somatic dysfunction of pelvic region: Secondary | ICD-10-CM | POA: Diagnosis not present

## 2017-06-21 DIAGNOSIS — M9903 Segmental and somatic dysfunction of lumbar region: Secondary | ICD-10-CM | POA: Diagnosis not present

## 2017-06-21 DIAGNOSIS — M5431 Sciatica, right side: Secondary | ICD-10-CM | POA: Diagnosis not present

## 2017-06-21 DIAGNOSIS — M9905 Segmental and somatic dysfunction of pelvic region: Secondary | ICD-10-CM | POA: Diagnosis not present

## 2017-06-22 DIAGNOSIS — M9903 Segmental and somatic dysfunction of lumbar region: Secondary | ICD-10-CM | POA: Diagnosis not present

## 2017-06-22 DIAGNOSIS — M5431 Sciatica, right side: Secondary | ICD-10-CM | POA: Diagnosis not present

## 2017-06-22 DIAGNOSIS — M9905 Segmental and somatic dysfunction of pelvic region: Secondary | ICD-10-CM | POA: Diagnosis not present

## 2017-06-26 DIAGNOSIS — M9903 Segmental and somatic dysfunction of lumbar region: Secondary | ICD-10-CM | POA: Diagnosis not present

## 2017-06-26 DIAGNOSIS — M9905 Segmental and somatic dysfunction of pelvic region: Secondary | ICD-10-CM | POA: Diagnosis not present

## 2017-06-26 DIAGNOSIS — M5431 Sciatica, right side: Secondary | ICD-10-CM | POA: Diagnosis not present

## 2017-07-03 DIAGNOSIS — M9905 Segmental and somatic dysfunction of pelvic region: Secondary | ICD-10-CM | POA: Diagnosis not present

## 2017-07-03 DIAGNOSIS — M9903 Segmental and somatic dysfunction of lumbar region: Secondary | ICD-10-CM | POA: Diagnosis not present

## 2017-07-03 DIAGNOSIS — M5431 Sciatica, right side: Secondary | ICD-10-CM | POA: Diagnosis not present

## 2017-07-05 DIAGNOSIS — M9903 Segmental and somatic dysfunction of lumbar region: Secondary | ICD-10-CM | POA: Diagnosis not present

## 2017-07-05 DIAGNOSIS — M9905 Segmental and somatic dysfunction of pelvic region: Secondary | ICD-10-CM | POA: Diagnosis not present

## 2017-07-05 DIAGNOSIS — M5431 Sciatica, right side: Secondary | ICD-10-CM | POA: Diagnosis not present

## 2017-07-10 DIAGNOSIS — M5431 Sciatica, right side: Secondary | ICD-10-CM | POA: Diagnosis not present

## 2017-07-10 DIAGNOSIS — M9905 Segmental and somatic dysfunction of pelvic region: Secondary | ICD-10-CM | POA: Diagnosis not present

## 2017-07-10 DIAGNOSIS — M9903 Segmental and somatic dysfunction of lumbar region: Secondary | ICD-10-CM | POA: Diagnosis not present

## 2017-07-12 DIAGNOSIS — M9903 Segmental and somatic dysfunction of lumbar region: Secondary | ICD-10-CM | POA: Diagnosis not present

## 2017-07-12 DIAGNOSIS — M9905 Segmental and somatic dysfunction of pelvic region: Secondary | ICD-10-CM | POA: Diagnosis not present

## 2017-07-12 DIAGNOSIS — M5431 Sciatica, right side: Secondary | ICD-10-CM | POA: Diagnosis not present

## 2017-08-09 DIAGNOSIS — M9903 Segmental and somatic dysfunction of lumbar region: Secondary | ICD-10-CM | POA: Diagnosis not present

## 2017-08-09 DIAGNOSIS — M5431 Sciatica, right side: Secondary | ICD-10-CM | POA: Diagnosis not present

## 2017-08-09 DIAGNOSIS — M9905 Segmental and somatic dysfunction of pelvic region: Secondary | ICD-10-CM | POA: Diagnosis not present

## 2017-08-10 ENCOUNTER — Other Ambulatory Visit: Payer: Self-pay | Admitting: Obstetrics and Gynecology

## 2017-08-10 DIAGNOSIS — Z1231 Encounter for screening mammogram for malignant neoplasm of breast: Secondary | ICD-10-CM

## 2017-08-11 ENCOUNTER — Ambulatory Visit
Admission: RE | Admit: 2017-08-11 | Discharge: 2017-08-11 | Disposition: A | Discharge status: Home / Self Care | Payer: 59 | Source: Ambulatory Visit | Attending: Obstetrics and Gynecology | Admitting: Obstetrics and Gynecology

## 2017-08-11 DIAGNOSIS — Z1231 Encounter for screening mammogram for malignant neoplasm of breast: Secondary | ICD-10-CM | POA: Diagnosis not present

## 2017-08-18 ENCOUNTER — Encounter: Payer: Self-pay | Admitting: Nurse Practitioner

## 2017-08-18 ENCOUNTER — Ambulatory Visit: Payer: 59 | Admitting: Nurse Practitioner

## 2017-08-18 VITALS — BP 136/65 | HR 85 | Resp 16 | Ht 62.0 in | Wt 157.0 lb

## 2017-08-18 DIAGNOSIS — E663 Overweight: Secondary | ICD-10-CM | POA: Diagnosis not present

## 2017-08-18 MED ORDER — PHENTERMINE HCL 37.5 MG PO TABS: 37.5000 mg | ORAL_TABLET | Freq: Every day | ORAL | 0 refills | Status: DC

## 2017-08-18 NOTE — Progress Notes (Signed)
[facility] [place], [place] [address]  Internal MEDICINE  Office Visit Note  Patient Name: Lindsay Edwards  [id number]  [id number]  Date of Service: 09/09/2017  No chief complaint on file.   The patient is here for routine follow up visit. Today, she is here to discuss recent weight gai. She has gained approximately 3 to 5 pounds since she was last here. Would like to go back on appetite suppressant to help her lose weight. Has taken phentermine in the past and has done well. She has restarted regular workouts at the gym. She has started limiting her calorie intake to 1200 calories per day. She denies having had negative side effects from  Phentermine in the past.    Pt is here for routine follow up.    Current Medication: Outpatient Encounter Medications as of 08/18/2017  Medication Sig  . ALPRAZolam (XANAX) 0.5 MG tablet take 1/2 to 1 tablet by mouth every 6 to 8 hours if needed  . citalopram (CELEXA) 10 MG tablet   . phentermine (ADIPEX-P) 37.5 MG tablet Take 1 tablet (37.5 mg total) by mouth daily before breakfast. (Patient not taking: Reported on 08/28/2017)  . [DISCONTINUED] phentermine (ADIPEX-P) 37.5 MG tablet take 1 tablet by mouth daily for WEIGHT LOSS   No facility-administered encounter medications on file as of 08/18/2017.     Surgical History: Past Surgical History:  Procedure Laterality Date  . BUNIONECTOMY  2018  . CESAREAN SECTION  2005  . ESOPHAGOGASTRODUODENOSCOPY N/A 10/31/2014   Procedure: ESOPHAGOGASTRODUODENOSCOPY (EGD);  Surgeon: Lucilla Lame, MD;  Location: [place];  Service: Gastroenterology;  Laterality: N/A;  . LAPAROTOMY  2002  . TUMOR EXCISION  2011   throat    Medical History: Past Medical History:  Diagnosis Date  . Anxiety   . GERD (gastroesophageal reflux disease)   . PMS (premenstrual syndrome)   . Vertigo    couple mos ago - Dr Tami Ribas "fixed the crystals"    Family History: Family History   Problem Relation Age of Onset  . Diabetes Maternal Grandmother   . Breast cancer Neg Hx     Social History   Socioeconomic History  . Marital status: Married    Spouse name: Not on file  . Number of children: Not on file  . Years of education: Not on file  . Highest education level: Not on file  Occupational History  . Not on file  Social Needs  . Financial resource strain: Not on file  . Food insecurity:    Worry: Not on file    Inability: Not on file  . Transportation needs:    Medical: Not on file    Non-medical: Not on file  Tobacco Use  . Smoking status: Never Smoker  . Smokeless tobacco: Never Used  Substance and Sexual Activity  . Alcohol use: No  . Drug use: No  . Sexual activity: Not on file  Lifestyle  . Physical activity:    Days per week: Not on file    Minutes per session: Not on file  . Stress: Not on file  Relationships  . Social connections:    Talks on phone: Not on file    Gets together: Not on file    Attends religious service: Not on file    Active member of club or organization: Not on file    Attends meetings of clubs or organizations: Not on file    Relationship status: Not on file  .  Intimate partner violence:    Fear of current or ex partner: Not on file    Emotionally abused: Not on file    Physically abused: Not on file    Forced sexual activity: Not on file  Other Topics Concern  . Not on file  Social History Narrative  . Not on file      Review of Systems  Constitutional: Positive for unexpected weight change. Negative for chills and fatigue.       Weight gain of 3-5 pounds over the past several months.   HENT: Negative for congestion, postnasal drip, rhinorrhea, sneezing and sore throat.   Eyes: Negative.  Negative for redness.  Respiratory: Negative for cough, chest tightness, shortness of breath and wheezing.   Cardiovascular: Negative for chest pain and palpitations.  Gastrointestinal: Negative for abdominal pain,  constipation, diarrhea, nausea and vomiting.  Endocrine: Negative for cold intolerance, heat intolerance, polydipsia, polyphagia and polyuria.  Genitourinary: Negative.  Negative for dysuria and frequency.  Musculoskeletal: Negative for arthralgias, back pain, joint swelling and neck pain.  Skin: Negative for rash.  Allergic/Immunologic: Negative for environmental allergies.  Neurological: Negative.  Negative for tremors, numbness and headaches.  Hematological: Does not bruise/bleed easily.  Psychiatric/Behavioral: Negative for behavioral problems (Depression), sleep disturbance and suicidal ideas. The patient is not nervous/anxious.     Today's Vitals   08/18/17 1555  BP: 136/65  Pulse: 85  Resp: 16  SpO2: 98%  Weight: 157 lb (71.2 kg)  Height: 5\' 2"  (1.575 m)    Physical Exam  Constitutional: She is oriented to person, place, and time. She appears well-developed and well-nourished. No distress.  HENT:  Head: Normocephalic and atraumatic.  Mouth/Throat: Oropharynx is clear and moist. No oropharyngeal exudate.  Eyes: Pupils are equal, round, and reactive to light. EOM are normal.  Neck: Normal range of motion. Neck supple. No JVD present. No tracheal deviation present. No thyromegaly present.  Cardiovascular: Normal rate, regular rhythm and normal heart sounds. Exam reveals no gallop and no friction rub.  No murmur heard. Pulmonary/Chest: Effort normal and breath sounds normal. No respiratory distress. She has no wheezes. She has no rales. She exhibits no tenderness.  Abdominal: Soft. Bowel sounds are normal. There is no tenderness.  Musculoskeletal: Normal range of motion.  Lymphadenopathy:    She has no cervical adenopathy.  Neurological: She is alert and oriented to person, place, and time. No cranial nerve deficit.  Skin: Skin is warm and dry. She is not diaphoretic.  Psychiatric: She has a normal mood and affect. Her behavior is normal. Judgment and thought content normal.   Nursing note and vitals reviewed.  Assessment/Plan: 1. Overweight (BMI 25.0-29.9) Restart phentermine 37.5mg  tablets daily. Recommend she limit calorie intake to 1200 calories per day. Advised her to participate in routine cardiovascular exercise for 20 minutes per day, three to four times per week.  - phentermine (ADIPEX-P) 37.5 MG tablet; Take 1 tablet (37.5 mg total) by mouth daily before breakfast. (Patient not taking: Reported on 08/28/2017)  Dispense: 30 tablet; Refill: 0  General Counseling: Orpah verbalizes understanding of the findings of todays visit and agrees with plan of treatment. I have discussed any further diagnostic evaluation that may be needed or ordered today. We also reviewed her medications today. she has been encouraged to call the office with any questions or concerns that should arise related to todays visit.   There is a liability release in patients' chart. There has been a 10 minute discussion about the side  effects including but not limited to elevated blood pressure, anxiety, lack of sleep and dry mouth. Pt understands and will like to start/continue on appetite suppressant at this time. There will be one month RX given at the time of visit with proper follow up. Nova diet plan with restricted calories is given to the pt. Pt understands and agrees with  plan of treatment  This patient was seen by Leretha Pol, FNP- C in Collaboration with Dr Lavera Guise as a part of collaborative care agreement   Meds ordered this encounter  Medications  . phentermine (ADIPEX-P) 37.5 MG tablet    Sig: Take 1 tablet (37.5 mg total) by mouth daily before breakfast.    Dispense:  30 tablet    Refill:  0    Order Specific Question:   Supervising Provider    Answer:   Lavera Guise [[id number]]    Time spent: 20 Minutes      Dr Lavera Guise Internal medicine

## 2017-08-23 DIAGNOSIS — M5431 Sciatica, right side: Secondary | ICD-10-CM | POA: Diagnosis not present

## 2017-08-23 DIAGNOSIS — M9905 Segmental and somatic dysfunction of pelvic region: Secondary | ICD-10-CM | POA: Diagnosis not present

## 2017-08-23 DIAGNOSIS — M9903 Segmental and somatic dysfunction of lumbar region: Secondary | ICD-10-CM | POA: Diagnosis not present

## 2017-08-28 ENCOUNTER — Other Ambulatory Visit: Payer: Self-pay

## 2017-08-28 ENCOUNTER — Ambulatory Visit (INDEPENDENT_AMBULATORY_CARE_PROVIDER_SITE_OTHER): Payer: 59 | Admitting: Gastroenterology

## 2017-08-28 ENCOUNTER — Encounter: Payer: Self-pay | Admitting: Gastroenterology

## 2017-08-28 VITALS — BP 149/85 | HR 96 | Ht 62.0 in | Wt 157.0 lb

## 2017-08-28 DIAGNOSIS — R197 Diarrhea, unspecified: Secondary | ICD-10-CM

## 2017-08-28 NOTE — Progress Notes (Signed)
Gastroenterology Consultation  Referring Provider:     Carmon Ginsberg, PA Primary Care Physician:  Lindsay Ginsberg, PA Primary Gastroenterologist:  Dr. Allen Edwards     Reason for Consultation:     Diarrhea        HPI:   Lindsay Edwards is a 47 y.o. y/o female referred for consultation & management of diarrhea by Dr. Carmon Edwards, [name].  This patient comes to see me after seeing me in the past for heartburn and had undergone an upper endoscopy.  The patient now reports that she has been having diarrhea.  The patient reports that her diarrhea started at the end of last week.  She had it all day on Friday and then had a solid stool on Saturday but then started to have diarrhea again.  She states the diarrhea has been yellow even when she had the solid stools it was also yellow.  The patient is concerned that she may have celiac sprue.  There is no report of any unexplained weight loss or history of anemia.  Despite the profuse diarrhea the patient denies any nausea or vomiting.  The patient does have some crampy abdominal pain that she reports to be in the lower abdomen.  As of today she states she had a soft bowel movement but not diarrhea.  Past Medical History:  Diagnosis Date  . Anxiety   . GERD (gastroesophageal reflux disease)   . PMS (premenstrual syndrome)   . Vertigo    couple mos ago - Dr Lindsay Edwards "fixed the crystals"    Past Surgical History:  Procedure Laterality Date  . BUNIONECTOMY  2018  . CESAREAN SECTION  2005  . ESOPHAGOGASTRODUODENOSCOPY N/A 10/31/2014   Procedure: ESOPHAGOGASTRODUODENOSCOPY (EGD);  Surgeon: Lucilla Lame, MD;  Location: [place];  Service: Gastroenterology;  Laterality: N/A;  . LAPAROTOMY  2002  . TUMOR EXCISION  2011   throat    Prior to Admission medications   Medication Sig Start Date End Date Taking? Authorizing Provider  ALPRAZolam [name]) 0.5 MG tablet take 1/2 to 1 tablet by mouth every 6 to 8 hours if needed 11/23/16   [provider]  citalopram (CELEXA) 10 MG tablet  02/05/17   [provider]  phentermine (ADIPEX-P) 37.5 MG tablet Take 1 tablet (37.5 mg total) by mouth daily before breakfast. 08/18/17   Ronnell Freshwater, NP    Family History  Problem Relation Age of Onset  . Diabetes Maternal Grandmother   . Breast cancer Neg Hx      Social History   Tobacco Use  . Smoking status: Never Smoker  . Smokeless tobacco: Never Used  Substance Use Topics  . Alcohol use: No  . Drug use: No    Allergies as of 08/28/2017 - Review Complete 08/18/2017  Allergen Reaction Noted  . Sulfa antibiotics Rash 10/29/2014    Review of Systems:    All systems reviewed and negative except where noted in HPI.   Physical Exam:  There were no vitals taken for this visit. No LMP recorded. Patient has had an ablation. Psych:  Alert and cooperative. Normal mood and affect. General:   Alert,  Well-developed, well-nourished, pleasant and cooperative in NAD Head:  Normocephalic and atraumatic. Eyes:  Sclera clear, no icterus.   Conjunctiva pink. Ears:  Normal auditory acuity. Nose:  No deformity, discharge, or lesions. Mouth:  No deformity or lesions,oropharynx pink & moist. Neck:  Supple; no masses or thyromegaly. Lungs:  Respirations even and unlabored.  Clear throughout to auscultation.   No wheezes, crackles, or rhonchi. No acute distress. Heart:  Regular rate and rhythm; no murmurs, clicks, rubs, or gallops. Abdomen:  Normal bowel sounds.  No bruits.  Soft, non-tender and non-distended without masses, hepatosplenomegaly or hernias noted.  No guarding or rebound tenderness.  Negative Carnett sign.   Rectal:  Deferred.  Msk:  Symmetrical without gross deformities.  Good, equal movement & strength bilaterally. Pulses:  Normal pulses noted. Extremities:  No clubbing or edema.  No cyanosis. Neurologic:  Alert and oriented x3;  grossly normal neurologically. Skin:  Intact without significant lesions or  rashes.  No jaundice. Lymph Nodes:  No significant cervical adenopathy. Psych:  Alert and cooperative. Normal mood and affect.  Imaging Studies: Mm Screening Breast Tomo Bilateral  Result Date: 08/11/2017 CLINICAL DATA:  Screening. EXAM: DIGITAL SCREENING BILATERAL MAMMOGRAM WITH TOMO AND CAD COMPARISON:  Previous exam(s). ACR Breast Density Category b: There are scattered areas of fibroglandular density. FINDINGS: There are no findings suspicious for malignancy. Images were processed with CAD. IMPRESSION: No mammographic evidence of malignancy. A result letter of this screening mammogram will be mailed directly to the patient. RECOMMENDATION: Screening mammogram in one year. (Code:SM-B-01Y) BI-RADS CATEGORY  1: Negative. Electronically Signed   By: Nolon Nations M.D.   On: 08/11/2017 09:13    Assessment and Plan:   This patient comes today with a recent history of diarrhea.  The patient is worried that she may have celiac sprue.  The patient has had a solid bowel movement today and less abdominal discomfort.  She also reports that the bloating is less.  The patient has been told to avoid dairy products for 1 week to see if that helps her symptoms.  The patient will also have her blood sent off for celiac sprue.  If the patient's diarrhea continues she may need to have stool sample sent off and possibly a colonoscopy if the stool samples are negative.  The patient has been explained the plan and agrees with it.  Lucilla Lame, MD. Marval Regal   Note: This dictation was prepared with Dragon dictation along with smaller phrase technology. Any transcriptional errors that result from this process are unintentional.

## 2017-08-29 LAB — IRON AND TIBC
Iron Saturation: 10 % — ABNORMAL LOW (ref 15–55)
Iron: 36 ug/dL (ref 27–159)
Total Iron Binding Capacity: 352 ug/dL (ref 250–450)
UIBC: 316 ug/dL (ref 131–425)

## 2017-08-29 LAB — GLIA (IGA/G) + TTG IGA
ANTIGLIADIN ABS, IGA: 5 U (ref 0–19)
Gliadin IgG: 5 units (ref 0–19)
Transglutaminase IgA: <2 U/mL (ref 0–3)

## 2017-08-29 LAB — FERRITIN: FERRITIN: 34 ng/mL (ref 15–150)

## 2017-09-05 ENCOUNTER — Telehealth: Payer: Self-pay | Admitting: Gastroenterology

## 2017-09-05 NOTE — Telephone Encounter (Signed)
Patient would like for you to call her regarding results

## 2017-09-06 NOTE — Telephone Encounter (Signed)
LVM for pt to return my call.

## 2017-09-06 NOTE — Telephone Encounter (Signed)
-----   Message from Lucilla Lame, MD sent at 08/31/2017  6:10 PM EDT ----- The patient know that all 3 of the lab sent off for celiac sprue were negative. Her iron study showed that her iron saturation was low which can indicate that she may not be absorbing iron or may be losing iron. See if the patient's symptoms have improved. The patient should be set up for colonoscopy due to the low iron saturation.

## 2017-09-06 NOTE — Telephone Encounter (Signed)
Pt notified of results. Pt is now doing well. No need for colonoscopy at this time.

## 2017-09-09 DIAGNOSIS — E663 Overweight: Secondary | ICD-10-CM | POA: Insufficient documentation

## 2017-11-15 DIAGNOSIS — M5431 Sciatica, right side: Secondary | ICD-10-CM | POA: Diagnosis not present

## 2017-11-15 DIAGNOSIS — M9905 Segmental and somatic dysfunction of pelvic region: Secondary | ICD-10-CM | POA: Diagnosis not present

## 2017-11-15 DIAGNOSIS — M9903 Segmental and somatic dysfunction of lumbar region: Secondary | ICD-10-CM | POA: Diagnosis not present

## 2018-01-10 ENCOUNTER — Encounter: Payer: Self-pay | Admitting: Physician Assistant

## 2018-01-10 ENCOUNTER — Ambulatory Visit: Payer: 59 | Admitting: Physician Assistant

## 2018-01-10 VITALS — BP 124/84 | HR 87 | Temp 98.5°F | Wt 157.0 lb

## 2018-01-10 DIAGNOSIS — J029 Acute pharyngitis, unspecified: Secondary | ICD-10-CM

## 2018-01-10 LAB — POCT RAPID STREP A (OFFICE): Rapid Strep A Screen: NEGATIVE

## 2018-01-10 NOTE — Progress Notes (Signed)
Patient: Lindsay Edwards Female    DOB: 1970-08-25   47 y.o.   MRN: [id number] Visit Date: 01/10/2018  Today's Provider: Trinna Post, PA-C   Chief Complaint  Patient presents with  . Sore Throat   Subjective:    URI   This is a new problem. The current episode started in the past 7 days. The problem has been gradually worsening. There has been no fever. Associated symptoms include congestion, ear pain, sinus pain and sneezing. Pertinent negatives include no abdominal pain, chest pain, coughing, diarrhea, headaches, joint pain, joint swelling, nausea, neck pain, plugged ear sensation, rash or rhinorrhea.   Thought she noticed a blister in the back of her throat.      Allergies  Allergen Reactions  . Sulfa Antibiotics Rash     Current Outpatient Medications:  .  ALPRAZolam (XANAX) 0.5 MG tablet, take 1/2 to 1 tablet by mouth every 6 to 8 hours if needed, Disp: , Rfl: 0 .  citalopram (CELEXA) 10 MG tablet, , Disp: , Rfl: 0 .  benzonatate (TESSALON) 100 MG capsule, take 1 capsule by mouth three times a day if needed for cough, Disp: , Rfl: 0 .  fluorouracil (EFUDEX) 5 % cream, Apply at night to wart., Disp: , Rfl:  .  fluticasone (FLONASE) 50 MCG/ACT nasal spray, Place into the nose., Disp: , Rfl:  .  lansoprazole (PREVACID) 30 MG capsule, Take by mouth., Disp: , Rfl:  .  meclizine (ANTIVERT) 25 MG tablet, Take by mouth., Disp: , Rfl:  .  NASALCROM 5.2 MG/ACT nasal spray, , Disp: , Rfl: 0 .  phentermine (ADIPEX-P) 37.5 MG tablet, Take 1 tablet (37.5 mg total) by mouth daily before breakfast. (Patient not taking: Reported on 08/28/2017), Disp: 30 tablet, Rfl: 0  Review of Systems  Constitutional: Positive for fatigue. Negative for activity change, appetite change, chills, diaphoresis, fever and unexpected weight change.  HENT: Positive for congestion, ear pain, postnasal drip, sinus pressure, sinus pain, sneezing and voice change. Negative for ear discharge, hearing  loss, rhinorrhea, tinnitus and trouble swallowing.   Eyes: Positive for discharge. Negative for photophobia, pain, redness, itching and visual disturbance.  Respiratory: Negative.  Negative for cough.   Cardiovascular: Negative for chest pain.  Gastrointestinal: Negative.  Negative for abdominal pain, diarrhea and nausea.  Musculoskeletal: Negative for joint pain, neck pain and neck stiffness.  Skin: Negative for rash.  Neurological: Negative for dizziness, light-headedness and headaches.  Hematological: Positive for adenopathy.    Social History   Tobacco Use  . Smoking status: Never Smoker  . Smokeless tobacco: Never Used  Substance Use Topics  . Alcohol use: No   Objective:   BP 124/84 (BP Location: Right Arm, Patient Position: Sitting, Cuff Size: Large)   Pulse 87   Temp 98.5 F (36.9 C) (Oral)   Wt 157 lb (71.2 kg)   SpO2 98%   BMI 28.72 kg/m  Vitals:   01/10/18 1233  BP: 124/84  Pulse: 87  Temp: 98.5 F (36.9 C)  TempSrc: Oral  SpO2: 98%  Weight: 157 lb (71.2 kg)     Physical Exam  Constitutional: She is oriented to person, place, and time. She appears well-developed and well-nourished.  HENT:  Right Ear: Tympanic membrane normal. No swelling.  Left Ear: Tympanic membrane normal. No swelling.  Mouth/Throat: Uvula is midline, oropharynx is clear and moist and mucous membranes are normal. Oral lesions present. No oropharyngeal exudate, posterior oropharyngeal edema or posterior  oropharyngeal erythema. Tonsils are 0 on the right. Tonsils are 0 on the left.  Small healing lesion on left posterior pharynx.   Neck: Normal range of motion. Neck supple.  Cardiovascular: Normal rate, regular rhythm and normal heart sounds.  Pulmonary/Chest: Effort normal and breath sounds normal.  Lymphadenopathy:    She has no cervical adenopathy.  Neurological: She is alert and oriented to person, place, and time.  Psychiatric: She has a normal mood and affect. Her behavior is  normal.        Assessment & Plan:     1. Sore throat  Rapid strep negative, throat exam largely benign. Do think this is viral. Small healing lesion in posterior pharynx that may represent ulcer. Can gargle with liquid benadryl and take anti-inflammatories, salt water gargles. Expect this will self resolve.  -POCT strep A  Return if symptoms worsen or fail to improve.  The entirety of the information documented in the History of Present Illness, Review of Systems and Physical Exam were personally obtained by me. Portions of this information were initially documented by Ashley Royalty, CMA and reviewed by me for thoroughness and accuracy.         Trinna Post, PA-C  [facility]

## 2018-01-10 NOTE — Patient Instructions (Signed)

## 2018-01-12 ENCOUNTER — Telehealth: Payer: Self-pay | Admitting: Family Medicine

## 2018-01-12 DIAGNOSIS — J029 Acute pharyngitis, unspecified: Secondary | ICD-10-CM

## 2018-01-12 MED ORDER — AZITHROMYCIN 250 MG PO TABS: ORAL_TABLET | ORAL | 0 refills | Status: DC

## 2018-01-12 NOTE — Telephone Encounter (Signed)
Pt called back asking for status update on her request for an antibiotic to be sent to [facility]. Please advise. Thanks TNP

## 2018-01-12 NOTE — Telephone Encounter (Signed)
Pt states she is not doing any better from her visit 01/10/18 with Adriana.  States since symptoms have gotten worse, sinus pressure on right side, throat still sore and scratchy, still having drainage.  Pt requesting something be called into the pharmacy.    [facility] on [address] at [employer]

## 2018-01-12 NOTE — Telephone Encounter (Signed)
Sent zpak.

## 2018-01-18 ENCOUNTER — Encounter: Payer: Self-pay | Admitting: Family Medicine

## 2018-01-18 ENCOUNTER — Ambulatory Visit: Payer: 59 | Admitting: Family Medicine

## 2018-01-18 VITALS — BP 124/84 | HR 87 | Temp 98.8°F | Resp 16 | Wt 166.4 lb

## 2018-01-18 DIAGNOSIS — J0101 Acute recurrent maxillary sinusitis: Secondary | ICD-10-CM | POA: Diagnosis not present

## 2018-01-18 MED ORDER — AMOXICILLIN-POT CLAVULANATE 875-125 MG PO TABS: 1.0000 | ORAL_TABLET | Freq: Two times a day (BID) | ORAL | 0 refills | Status: DC

## 2018-01-18 NOTE — Patient Instructions (Addendum)
Let me know if not improving for consideration of an ENT referral. Add Mucinex D for congestion.

## 2018-01-18 NOTE — Progress Notes (Signed)
  Subjective:     Patient ID: Lindsay Edwards, female   DOB: 02/25/1971, 47 y.o.   MRN: [id number] Chief Complaint  Patient presents with  . Facial Pain    Patient comes in office today with complaints of sinus pain on the left side of her face. Patient states that she was being treated for sinus infection and has finished recent antibiotic prescribed 01/12/18. Patient reports pain in left ear and tenderness to the touch, patient still complains of sore throat and blister in back of her mouth and she states that she has had post drainage and congestion of left nostril.    HPI She is approximately two weeks out from the start of her sx. Reports improvement on Zithromax but reports return of left sided sinus pressure and ulcer in back of her throat after completing the abx.  Review of Systems     Objective:   Physical Exam  Constitutional: She appears well-developed and well-nourished. No distress.  Ears: T.M's intact without inflammation Sinuses: mild left maxillary sinus tenderness Throat: no tonsillar enlargement or exudate; ? Left posterior pharynx aphthous ulcer. Neck: no cervical adenopathy Lungs: clear     Assessment:    1. Acute recurrent maxillary sinusitis - amoxicillin-clavulanate (AUGMENTIN) 875-125 MG tablet; Take 1 tablet by mouth 2 (two) times daily.  Dispense: 20 tablet; Refill: 0    Plan:    Provided with saline rinse. Discussed use of Mucinex D. She has Magic Mouthwash at home.

## 2018-01-25 ENCOUNTER — Telehealth: Payer: Self-pay | Admitting: *Deleted

## 2018-01-25 NOTE — Telephone Encounter (Signed)
I was wanting to inquire about having foot surgery sometime in November.  If you could, call me back.

## 2018-02-02 NOTE — Telephone Encounter (Signed)
I left her a message to call and schedule a consultation with Dr. Milinda Pointer.

## 2018-02-07 ENCOUNTER — Ambulatory Visit: Payer: 59 | Admitting: Podiatry

## 2018-03-01 DIAGNOSIS — Z131 Encounter for screening for diabetes mellitus: Secondary | ICD-10-CM | POA: Diagnosis not present

## 2018-03-01 DIAGNOSIS — Z1321 Encounter for screening for nutritional disorder: Secondary | ICD-10-CM | POA: Diagnosis not present

## 2018-03-01 DIAGNOSIS — R635 Abnormal weight gain: Secondary | ICD-10-CM | POA: Diagnosis not present

## 2018-03-01 DIAGNOSIS — Z0000 Encounter for general adult medical examination without abnormal findings: Secondary | ICD-10-CM | POA: Diagnosis not present

## 2018-03-01 DIAGNOSIS — Z6832 Body mass index (BMI) 32.0-32.9, adult: Secondary | ICD-10-CM | POA: Diagnosis not present

## 2018-03-01 DIAGNOSIS — Z01419 Encounter for gynecological examination (general) (routine) without abnormal findings: Secondary | ICD-10-CM | POA: Diagnosis not present

## 2018-03-02 LAB — HM PAP SMEAR: HM Pap smear: NEGATIVE

## 2018-03-21 ENCOUNTER — Ambulatory Visit (INDEPENDENT_AMBULATORY_CARE_PROVIDER_SITE_OTHER): Payer: 59

## 2018-03-21 ENCOUNTER — Encounter: Payer: Self-pay | Admitting: Podiatry

## 2018-03-21 ENCOUNTER — Ambulatory Visit: Payer: 59 | Admitting: Podiatry

## 2018-03-21 DIAGNOSIS — M2012 Hallux valgus (acquired), left foot: Secondary | ICD-10-CM | POA: Diagnosis not present

## 2018-03-21 NOTE — Progress Notes (Signed)
She presents today with her husband with a chief concern of pain to the first metatarsal phalangeal joint of the left foot.  She states that I think I am ready to get this bunion fixed since she had the other one fix last year.  She states that has progressively worsened to the point where it is limiting her ability to perform her daily activities.  ROS: She denies fever chills nausea vomiting muscle aches pains calf pain back pain chest pain shortness of breath and headache.  Denies any changes in her health history.  All systems are negative.  Objective: I have reviewed her past medical history medications allergy surgeries social history.  Vital signs are stable she is alert and oriented x3.  Pulses are strongly palpable capillary fill time is immediate.  Neurologic sensorium is intact per Semmes Weinstein monofilament and deep tendon reflexes are intact and symmetrical bilateral.  Orthopedic evaluation demonstrates all joints distal to the ankle-full range of motion without crepitation.  He has hallux abductovalgus deformity which is painful on range of motion and painful on palpation of the hypertrophic medial condyle.  Muscle strength is normal symmetrical bilateral with no intrinsic wasting.  Radiographs taken today demonstrate an osseously mature individual with an increase in the first intermetatarsal angle greater than normal value with early dislocation of the first metatarsal phalangeal joint laterally.  Hallux abductus angle is greater than normal values.  Some dorsal elevation of the first metatarsal resulting in some early osteoarthritic change.  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  Plan: Discussed etiology pathology conservative versus surgical therapies.  At this point we discussed surgical intervention consisting of the first metatarsal osteotomy with double screw fixation to realign the first metatarsal  phalangeal joint and remove the painful bump.  She understands this and is amenable to it.  We discussed the possible postop complications which she is well aware of but went over them once again.  Discussing possible postop pain bleeding swelling infection recurrence need for further surgery overcorrection under correction loss of digit loss of limb loss of life.  She signed all 3 pages a consent form we dispensed a Cam walker and provided her with both oral and written home-going instructions for the morning of preop instructions for the surgery center and anesthesia group.

## 2018-03-21 NOTE — Patient Instructions (Signed)
Pre-Operative Instructions  Congratulations, you have decided to take an important step towards improving your quality of life.  You can be assured that the doctors and staff at [facility] will be with you every step of the way.  Here are some important things you should know:  1. Plan to be at the surgery center/hospital at least 1 (one) hour prior to your scheduled time, unless otherwise directed by the surgical center/hospital staff.  You must have a responsible adult accompany you, remain during the surgery and drive you home.  Make sure you have directions to the surgical center/hospital to ensure you arrive on time. 2. If you are having surgery at [facility] or [facility] hospitals, you will need a copy of your medical history and physical form from your family physician within one month prior to the date of surgery. We will give you a form for your primary physician to complete.  3. We make every effort to accommodate the date you request for surgery.  However, there are times where surgery dates or times have to be moved.  We will contact you as soon as possible if a change in schedule is required.   4. No aspirin/ibuprofen for one week before surgery.  If you are on aspirin, any non-steroidal anti-inflammatory medications (Mobic, Aleve, Ibuprofen) should not be taken seven (7) days prior to your surgery.  You make take Tylenol for pain prior to surgery.  5. Medications - If you are taking daily heart and blood pressure medications, seizure, reflux, allergy, asthma, anxiety, pain or diabetes medications, make sure you notify the surgery center/hospital before the day of surgery so they can tell you which medications you should take or avoid the day of surgery. 6. No food or drink after midnight the night before surgery unless directed otherwise by surgical center/hospital staff. 7. No alcoholic beverages 24-hours prior to surgery.  No smoking 24-hours prior or 24-hours after  surgery. 8. Wear loose pants or shorts. They should be loose enough to fit over bandages, boots, and casts. 9. Don't wear slip-on shoes. Sneakers are preferred. 10. Bring your boot with you to the surgery center/hospital.  Also bring crutches or a walker if your physician has prescribed it for you.  If you do not have this equipment, it will be provided for you after surgery. 11. If you have not been contacted by the surgery center/hospital by the day before your surgery, call to confirm the date and time of your surgery. 12. Leave-time from work may vary depending on the type of surgery you have.  Appropriate arrangements should be made prior to surgery with your employer. 13. Prescriptions will be provided immediately following surgery by your doctor.  Fill these as soon as possible after surgery and take the medication as directed. Pain medications will not be refilled on weekends and must be approved by the doctor. 14. Remove nail polish on the operative foot and avoid getting pedicures prior to surgery. 15. Wash the night before surgery.  The night before surgery wash the foot and leg well with water and the antibacterial soap provided. Be sure to pay special attention to beneath the toenails and in between the toes.  Wash for at least three (3) minutes. Rinse thoroughly with water and dry well with a towel.  Perform this wash unless told not to do so by your physician.  Enclosed: 1 Ice pack (please put in freezer the night before surgery)   1 Hibiclens skin cleaner     Pre-op instructions  If you have any questions regarding the instructions, please do not hesitate to call our office.  [place]: [address], , [place] [address] -- [phone]  [place]: [address] [place], [place] [address] -- [phone]  [place]: [address]  [place], [place] [address] -- [phone]  [place]: [address], [address], [place], [place] [address] -- [phone]  Website:  [email or web address] 

## 2018-03-24 IMAGING — MG MM DIGITAL SCREENING BILAT W/ TOMO W/ CAD
8 of 12 series · 8 of 28 positions shown · non-contrast
Comparison: Previous exam(s).

CLINICAL DATA: Screening.

EXAM:
DIGITAL SCREENING BILATERAL MAMMOGRAM WITH TOMO AND CAD

[L CC]
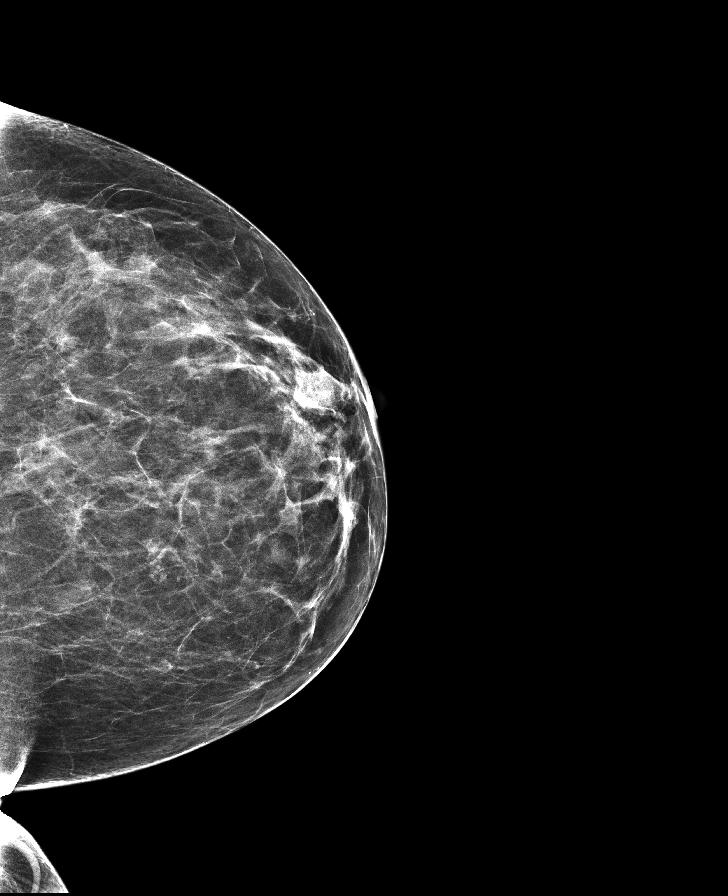

[R CC]
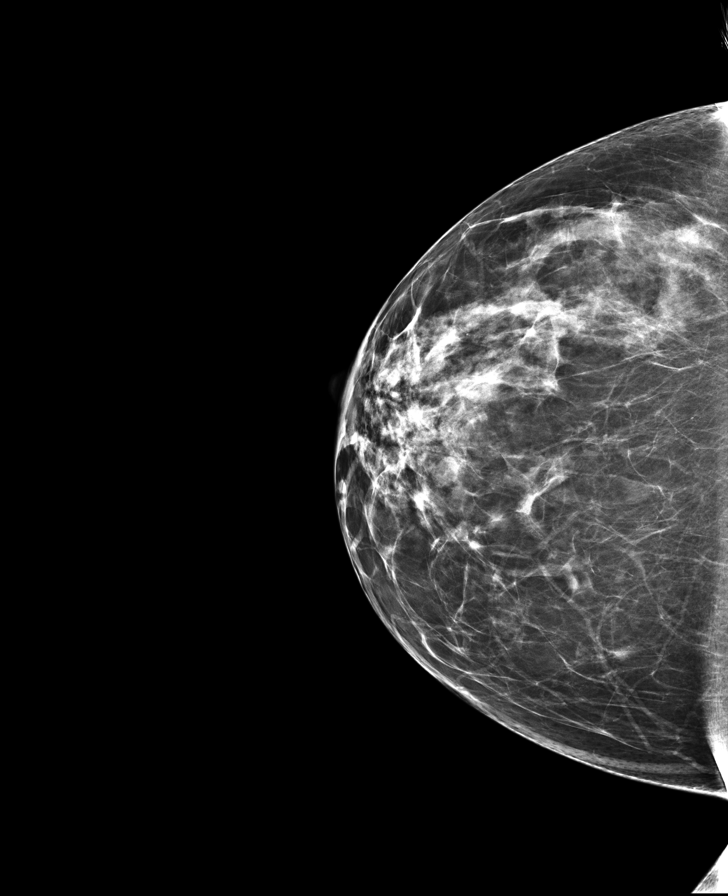

[R MLO synth-2D]
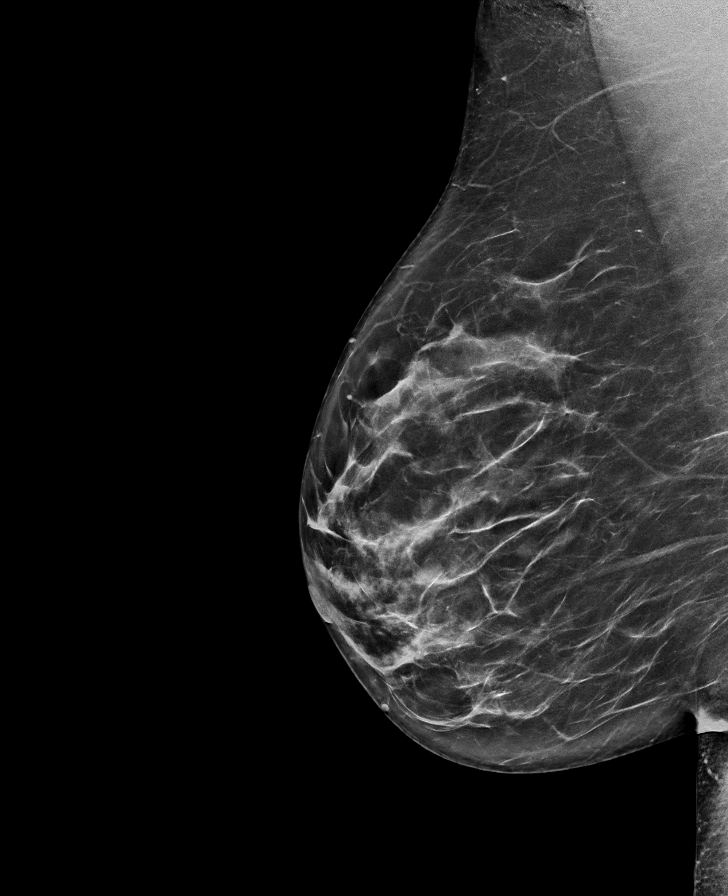

[L MLO synth-2D]
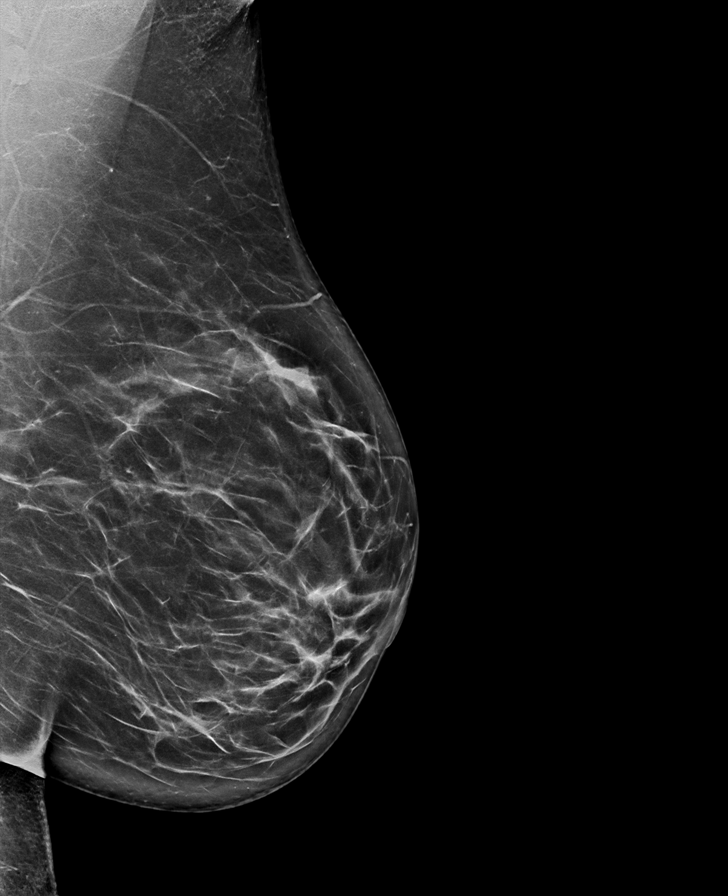

[L MLO]
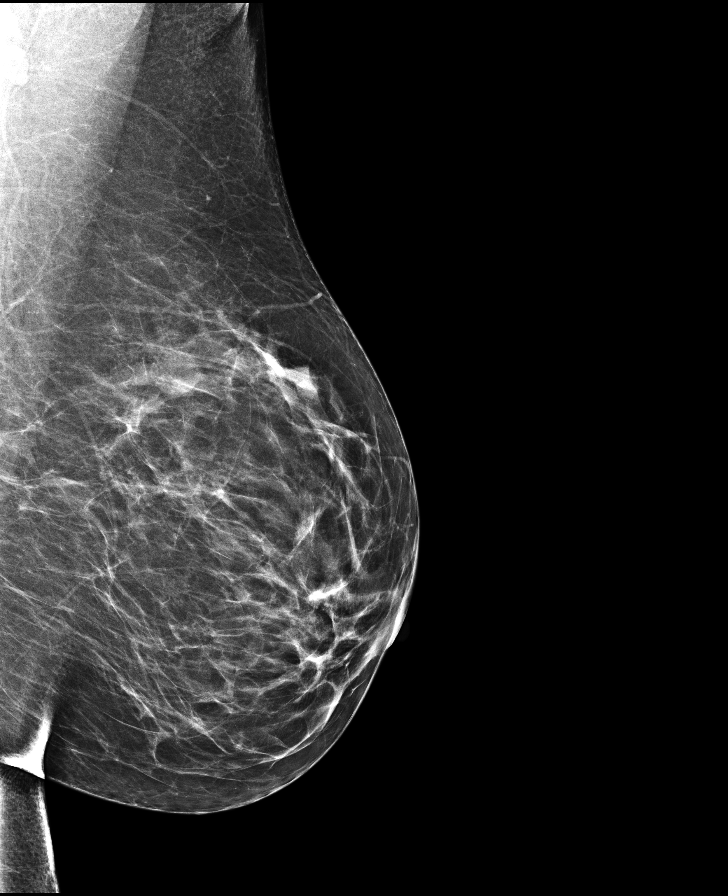

[R MLO]
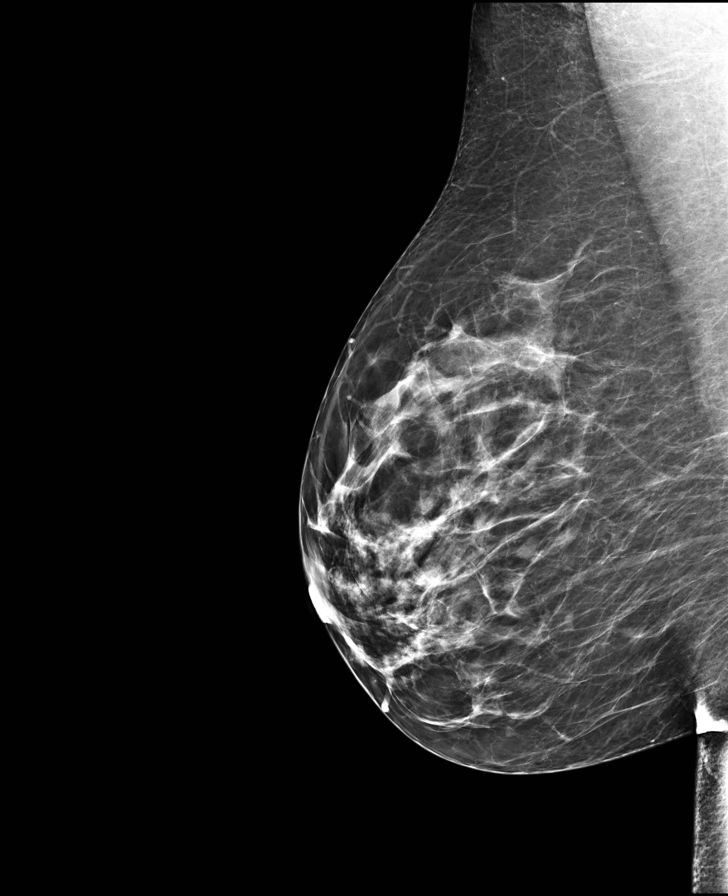

[L CC synth-2D]
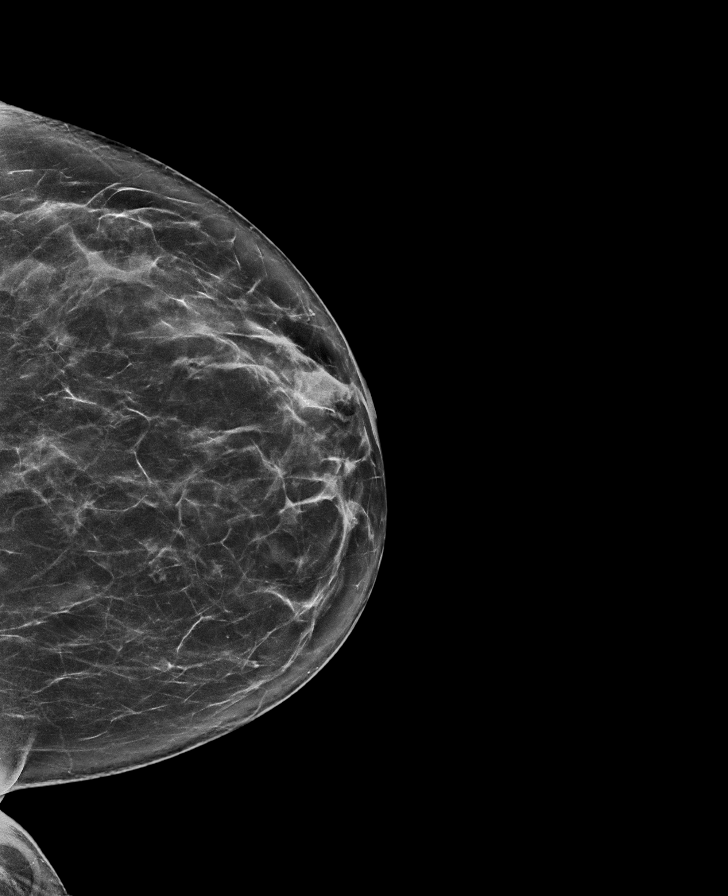

[R CC synth-2D]
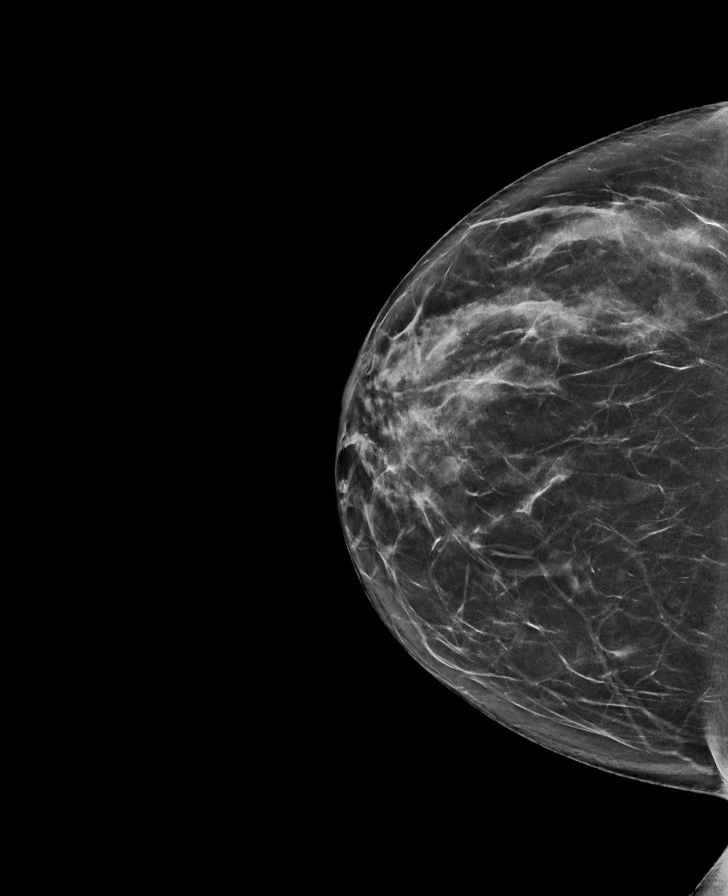

[8 of 28 positions shown; findings below may reference images not displayed]

ACR Breast Density Category b: There are scattered areas of
fibroglandular density.
FINDINGS: There are no findings suspicious for malignancy. Images were
processed with CAD.
IMPRESSION: No mammographic evidence of malignancy. A result letter of this
screening mammogram will be mailed directly to the patient.

RECOMMENDATION:
Screening mammogram in one year. (Code:[id number])

BI-RADS CATEGORY  1: Negative.

## 2018-04-24 ENCOUNTER — Encounter: Payer: Self-pay | Admitting: Family Medicine

## 2018-04-24 ENCOUNTER — Ambulatory Visit: Payer: 59 | Admitting: Family Medicine

## 2018-04-24 VITALS — BP 128/76 | HR 82 | Temp 99.0°F | Wt 169.0 lb

## 2018-04-24 DIAGNOSIS — J069 Acute upper respiratory infection, unspecified: Secondary | ICD-10-CM | POA: Diagnosis not present

## 2018-04-24 NOTE — Patient Instructions (Signed)
Discussed use of Mucinex D for congestion, Delsym for cough, and Benadryl for postnasal drainage 

## 2018-04-24 NOTE — Progress Notes (Signed)
  Subjective:     Patient ID: Lindsay Edwards, female   DOB: 04-19-1971, 47 y.o.   MRN: [id number] Chief Complaint  Patient presents with  . URI    Patient presents today for URI symptoms since 04/23/2018. Patient states she is having the following symptoms:sore throat, heachache, achy feeling and chills. Patient states she may have been exposed to the flu and strep.   HPI Has been taking ibuprofen for her sx.  Review of Systems     Objective:   Physical Exam  Constitutional: She appears well-developed and well-nourished. No distress.  Ears: T.M's intact without inflammation Throat: no tonsillar enlargement or exudate Neck: no cervical adenopathy Lungs: clear     Assessment:    1. URI, acute     Plan:    Discussed use of Mucinex D for congestion, Delsym for cough, and Benadryl for postnasal drainage. To call if not improving over the next 5 days.

## 2018-05-02 ENCOUNTER — Other Ambulatory Visit: Payer: Self-pay | Admitting: Podiatry

## 2018-05-02 MED ORDER — CEPHALEXIN 500 MG PO CAPS: 500.0000 mg | ORAL_CAPSULE | Freq: Three times a day (TID) | ORAL | 0 refills | Status: DC

## 2018-05-02 MED ORDER — OXYCODONE-ACETAMINOPHEN 10-325 MG PO TABS: 1.0000 | ORAL_TABLET | Freq: Four times a day (QID) | ORAL | 0 refills | Status: AC | PRN

## 2018-05-02 MED ORDER — ONDANSETRON HCL 4 MG PO TABS: 4.0000 mg | ORAL_TABLET | Freq: Three times a day (TID) | ORAL | 0 refills | Status: DC | PRN

## 2018-05-04 ENCOUNTER — Encounter: Payer: Self-pay | Admitting: Podiatry

## 2018-05-04 DIAGNOSIS — M21612 Bunion of left foot: Secondary | ICD-10-CM | POA: Diagnosis not present

## 2018-05-04 DIAGNOSIS — M2012 Hallux valgus (acquired), left foot: Secondary | ICD-10-CM

## 2018-05-04 DIAGNOSIS — M25572 Pain in left ankle and joints of left foot: Secondary | ICD-10-CM | POA: Diagnosis not present

## 2018-05-08 ENCOUNTER — Encounter: Payer: Self-pay | Admitting: Podiatry

## 2018-05-08 ENCOUNTER — Ambulatory Visit (INDEPENDENT_AMBULATORY_CARE_PROVIDER_SITE_OTHER): Payer: 59

## 2018-05-08 ENCOUNTER — Ambulatory Visit (INDEPENDENT_AMBULATORY_CARE_PROVIDER_SITE_OTHER): Payer: 59 | Admitting: Podiatry

## 2018-05-08 DIAGNOSIS — M2012 Hallux valgus (acquired), left foot: Secondary | ICD-10-CM | POA: Diagnosis not present

## 2018-05-08 DIAGNOSIS — Z9889 Other specified postprocedural states: Secondary | ICD-10-CM

## 2018-05-09 NOTE — Progress Notes (Signed)
   Subjective:  Patient presents today status post bunionectomy left. DOS: 05/04/18. She states she is doing well. She reports the swelling has decreased and she denies any significant pain. There are no modifying factors noted. She has been using the CAM boot as directed. Patient is here for further evaluation and treatment.    Past Medical History:  Diagnosis Date  . Anxiety   . GERD (gastroesophageal reflux disease)   . PMS (premenstrual syndrome)   . Vertigo    couple mos ago - Dr Tami Ribas "fixed the crystals"      Objective/Physical Exam Neurovascular status intact.  Skin incisions appear to be well coapted with sutures and staples intact. No sign of infectious process noted. No dehiscence. No active bleeding noted. Moderate edema noted to the surgical extremity.  Radiographic Exam:  Orthopedic hardware and osteotomies sites appear to be stable with routine healing.  Assessment: 1. s/p bunionectomy left. DOS: 05/04/18   Plan of Care:  1. Patient was evaluated. X-rays reviewed 2. Dressing changed. Keep clean, dry and intact.  3. Continue weightbearing in CAM boot.  4. Return to clinic in one week.    Edrick Kins, DPM [facility]  Dr. Edrick Kins, [address]                                        [place], [place] [address]                Office [phone]  Fax [phone]

## 2018-05-16 ENCOUNTER — Encounter: Payer: Self-pay | Admitting: Podiatry

## 2018-05-16 ENCOUNTER — Ambulatory Visit (INDEPENDENT_AMBULATORY_CARE_PROVIDER_SITE_OTHER): Payer: 59 | Admitting: Podiatry

## 2018-05-16 DIAGNOSIS — Z9889 Other specified postprocedural states: Secondary | ICD-10-CM

## 2018-05-16 DIAGNOSIS — M2012 Hallux valgus (acquired), left foot: Secondary | ICD-10-CM

## 2018-05-16 NOTE — Progress Notes (Signed)
Presents today date of surgery 05/04/2018 status post [facility] bunionectomy left.  States that is really been itching a lot.  Objective: Vital signs are stable she is alert and oriented x3 there is minimal edema no erythema cellulitis drainage or odor she has great range of motion mildly tender.  Radiographs demonstrate a well-healing osteotomy good position.  Assessment: Well-healing surgical foot.  Plan: Encourage range of motion exercises place her in a compression anklet and a Darco shoe follow-up with her in about 2 weeks at which time we will try to get her into a tennis shoe x-rays will be taken at that time.

## 2018-05-30 ENCOUNTER — Ambulatory Visit (INDEPENDENT_AMBULATORY_CARE_PROVIDER_SITE_OTHER): Payer: 59 | Admitting: Podiatry

## 2018-05-30 ENCOUNTER — Encounter: Payer: Self-pay | Admitting: Podiatry

## 2018-05-30 ENCOUNTER — Ambulatory Visit (INDEPENDENT_AMBULATORY_CARE_PROVIDER_SITE_OTHER): Payer: 59

## 2018-05-30 DIAGNOSIS — M2012 Hallux valgus (acquired), left foot: Secondary | ICD-10-CM

## 2018-05-30 DIAGNOSIS — Z9889 Other specified postprocedural states: Secondary | ICD-10-CM

## 2018-05-30 NOTE — Progress Notes (Signed)
She presents today date of surgery 05/04/2018 status post Liane Comber bunionectomy left states that is doing good I have slight pain here and there she does have a chief complaint of a dull ache behind the knee.  Objective: Vital signs stable she alert and oriented x3.  Pulses are palpable.  She has swelling to the left foot increased from previous evaluation.  She has good range of motion dorsiflexion plantarflexion is limited to sesamoids.  Tender on palpation of the plantar sesamoidal apparatus.  Radiographs taken today do demonstrate soft tissue swelling but capital osteotomy is in good position just as it was.  Assessment: Well-healing surgical foot.  Plan: Encouraged her to keep foot elevated try contrast baths and she can start wearing a tennis shoe as of next week.

## 2018-06-18 ENCOUNTER — Ambulatory Visit (INDEPENDENT_AMBULATORY_CARE_PROVIDER_SITE_OTHER): Payer: 59 | Admitting: Podiatry

## 2018-06-18 ENCOUNTER — Encounter: Payer: Self-pay | Admitting: Podiatry

## 2018-06-18 ENCOUNTER — Ambulatory Visit (INDEPENDENT_AMBULATORY_CARE_PROVIDER_SITE_OTHER): Payer: 59

## 2018-06-18 DIAGNOSIS — M2012 Hallux valgus (acquired), left foot: Secondary | ICD-10-CM

## 2018-06-18 DIAGNOSIS — Z9889 Other specified postprocedural states: Secondary | ICD-10-CM

## 2018-06-18 NOTE — Progress Notes (Signed)
She presents today for follow-up of her [place] left date of surgery May 04, 2018.  She states that is doing fine is little swollen still would like to get back to her activity level.  She will get back to work starting next week.  Objective: Vital signs are stable she is alert oriented x3 there is no erythematous mild edema no cellulitis drainage or odor good full range of motion of the first metatarsophalangeal joint left.  Radiographs taken today demonstrate motion of the capital osteotomy which is gone on to heal secondarily.  Assessment: Well-healing surgical foot.  Plan: Follow-up with me in about 1 month allow her to start getting back to her regular walking regimen.

## 2018-07-18 ENCOUNTER — Encounter: Payer: Self-pay | Admitting: Podiatry

## 2018-07-18 ENCOUNTER — Ambulatory Visit (INDEPENDENT_AMBULATORY_CARE_PROVIDER_SITE_OTHER): Payer: 59 | Admitting: Podiatry

## 2018-07-18 ENCOUNTER — Ambulatory Visit (INDEPENDENT_AMBULATORY_CARE_PROVIDER_SITE_OTHER): Payer: 59

## 2018-07-18 DIAGNOSIS — M2012 Hallux valgus (acquired), left foot: Secondary | ICD-10-CM

## 2018-07-18 DIAGNOSIS — Z9889 Other specified postprocedural states: Secondary | ICD-10-CM

## 2018-07-19 NOTE — Progress Notes (Signed)
She presents today date of surgery 05/04/2018 status post Liane Comber bunionectomy left states that I have the occasional ache across the top of the foot but is probably really due to the shoes and wearing and seems to be doing pretty good.  Objective: Vital signs are stable she is alert and oriented x3 hallux abductovalgus repair left foot appears to be healing very nicely.  There is no erythema edema cellulitis drainage or odor radiographically it appears that the capital fragment had tipped medially but has gone on to heal uneventfully and that position.  We did lose some correction but overall she is still much improved than previous.  Assessment: Well-healing surgical foot.  Plan: Follow-up with [place] on an as-needed basis.

## 2018-08-07 ENCOUNTER — Other Ambulatory Visit: Payer: Self-pay | Admitting: Obstetrics and Gynecology

## 2018-08-07 DIAGNOSIS — Z1231 Encounter for screening mammogram for malignant neoplasm of breast: Secondary | ICD-10-CM

## 2018-08-17 ENCOUNTER — Ambulatory Visit
Admission: RE | Admit: 2018-08-17 | Discharge: 2018-08-17 | Disposition: A | Discharge status: Home / Self Care | Payer: 59 | Source: Ambulatory Visit | Attending: Obstetrics and Gynecology | Admitting: Obstetrics and Gynecology

## 2018-08-17 DIAGNOSIS — Z1231 Encounter for screening mammogram for malignant neoplasm of breast: Secondary | ICD-10-CM

## 2018-08-17 LAB — HM MAMMOGRAPHY

## 2018-10-24 DIAGNOSIS — L905 Scar conditions and fibrosis of skin: Secondary | ICD-10-CM | POA: Diagnosis not present

## 2018-10-24 DIAGNOSIS — R208 Other disturbances of skin sensation: Secondary | ICD-10-CM | POA: Diagnosis not present

## 2018-10-24 DIAGNOSIS — D179 Benign lipomatous neoplasm, unspecified: Secondary | ICD-10-CM | POA: Diagnosis not present

## 2019-03-08 ENCOUNTER — Ambulatory Visit: Payer: 59 | Admitting: Physician Assistant

## 2019-03-08 ENCOUNTER — Encounter: Payer: Self-pay | Admitting: Physician Assistant

## 2019-03-08 ENCOUNTER — Other Ambulatory Visit: Payer: Self-pay

## 2019-03-08 VITALS — BP 125/79 | HR 85 | Temp 97.4°F | Resp 16 | Wt 164.2 lb

## 2019-03-08 DIAGNOSIS — M545 Low back pain, unspecified: Secondary | ICD-10-CM

## 2019-03-08 DIAGNOSIS — M549 Dorsalgia, unspecified: Secondary | ICD-10-CM

## 2019-03-08 DIAGNOSIS — R319 Hematuria, unspecified: Secondary | ICD-10-CM

## 2019-03-08 DIAGNOSIS — D171 Benign lipomatous neoplasm of skin and subcutaneous tissue of trunk: Secondary | ICD-10-CM

## 2019-03-08 LAB — POCT URINALYSIS DIPSTICK
Bilirubin, UA: NEGATIVE
Glucose, UA: NEGATIVE
Ketones, UA: NEGATIVE
Leukocytes, UA: NEGATIVE
Nitrite, UA: NEGATIVE
Protein, UA: NEGATIVE
Spec Grav, UA: >=1.03 — AB (ref 1.010–1.025)
Urobilinogen, UA: 0.2 E.U./dL
pH, UA: 6 (ref 5.0–8.0)

## 2019-03-08 NOTE — Progress Notes (Signed)
Patient: Lindsay Edwards Female    DOB: Sep 07, 1970   48 y.o.   MRN: [id number] Visit Date: 03/08/2019  Today's Provider: Trinna Post, PA-C   Chief Complaint  Patient presents with  . Back Pain   Subjective:    I,Joseline E. Rosas,RMA am acting as a Education administrator for [employer], PA-C.  Back Pain This is a new (Patient has arthritis and chronic back pain) problem. The current episode started 1 to 4 weeks ago. The problem has been gradually worsening (with worsening since Tuesday) since onset. Pain location: Left side of back  The quality of the pain is described as aching (bruised feeling). The pain does not radiate. The pain is at a severity of 7/10 (with standing up with movement). The pain is moderate. The pain is the same all the time. The symptoms are aggravated by position, standing and twisting. Pertinent negatives include no chest pain, headaches, leg pain, numbness or tingling. Risk factors: Arthritis. She has tried home exercises for the symptoms. The treatment provided no relief.   She is concerned about pancreatic or splenic source of pain. Denies fevers, chills, nausea, vomiting, dysuria.  She has a history of lipoma removal from her neck by Dr. Tami Ribas and from her side by dermatologist.   Allergies  Allergen Reactions  . Sulfa Antibiotics Rash     Current Outpatient Medications:  .  ALPRAZolam (XANAX) 0.5 MG tablet, take 1/2 to 1 tablet by mouth every 6 to 8 hours if needed, Disp: , Rfl: 0 .  citalopram (CELEXA) 10 MG tablet, , Disp: , Rfl: 0 .  ondansetron (ZOFRAN) 4 MG tablet, Take 1 tablet (4 mg total) by mouth every 8 (eight) hours as needed for nausea or vomiting. (Patient not taking: Reported on 03/08/2019), Disp: 20 tablet, Rfl: 0 .  phentermine (ADIPEX-P) 37.5 MG tablet, TK 1 T PO QAM, Disp: , Rfl:   Review of Systems  Cardiovascular: Negative for chest pain.  Musculoskeletal: Positive for back pain.  Neurological: Negative for tingling, numbness and  headaches.    Social History   Tobacco Use  . Smoking status: Never Smoker  . Smokeless tobacco: Never Used  Substance Use Topics  . Alcohol use: No      Objective:   BP 125/79 (BP Location: Left Arm, Patient Position: Sitting, Cuff Size: Large)   Pulse 85   Temp (!) 97.4 F (36.3 C) (Other (Comment)) Comment (Src): forehead  Resp 16   Wt 164 lb 3.2 oz (74.5 kg)   BMI 30.03 kg/m  Vitals:   03/08/19 1341  BP: 125/79  Pulse: 85  Resp: 16  Temp: (!) 97.4 F (36.3 C)  TempSrc: Other (Comment)  Weight: 164 lb 3.2 oz (74.5 kg)  Body mass index is 30.03 kg/m.   Physical Exam Constitutional:      Appearance: Normal appearance.  Cardiovascular:     Rate and Rhythm: Normal rate and regular rhythm.     Heart sounds: Normal heart sounds.  Pulmonary:     Effort: Pulmonary effort is normal.     Breath sounds: Normal breath sounds.  Abdominal:     General: Bowel sounds are normal.     Palpations: Abdomen is soft.    Skin:    General: Skin is warm and dry.  Neurological:     Mental Status: She is alert and oriented to person, place, and time. Mental status is at baseline.  Psychiatric:  Mood and Affect: Mood normal.        Behavior: Behavior normal.      No results found for any visits on 03/08/19.     Assessment & Plan    1. Acute low back pain without sciatica, unspecified back pain laterality  Do think this is MSK. Recommend continuing ibuprofen.   - POCT urinalysis dipstick  2. Left-sided back pain, unspecified back location, unspecified chronicity  Send for UA microscopy.   - POCT urinalysis dipstick  3. Lipoma of torso  Do feel discrete mass that feels like lipoma. Will refer to surgery for potential removal.   - Ambulatory referral to General Surgery  The entirety of the information documented in the History of Present Illness, Review of Systems and Physical Exam were personally obtained by me. Portions of this information were initially  documented by Lyndel Pleasure, CMA and reviewed by me for thoroughness and accuracy.         Trinna Post, PA-C  [facility]

## 2019-03-08 NOTE — Patient Instructions (Signed)

## 2019-03-09 LAB — URINALYSIS, MICROSCOPIC ONLY
Casts: NONE SEEN /lpf
Epithelial Cells (non renal): >10 /hpf — AB (ref 0–10)

## 2019-03-13 ENCOUNTER — Telehealth: Payer: Self-pay

## 2019-03-13 NOTE — Telephone Encounter (Signed)
Pt advised.  She states she has an appointment with GYN in October and they always check her urine.  Would it be okay if they send the results to you?   Thanks,  -Mickel Baas

## 2019-03-13 NOTE — Telephone Encounter (Signed)
Yes, that would be great. They would generally have to do a microscopic analysis to confirm for sure.

## 2019-03-13 NOTE — Telephone Encounter (Signed)
-----   Message from Trinna Post, [place] sent at 03/13/2019  8:23 AM EDT ----- Please call and schedule patient for one month follow up, can be virtual, for hematuria.    Lindsay Edwards,  The microscopic analysis of your urine did show some red blood cells. We usually confirm this on a recheck in a month or so. I'll have my medical assistant reach out to you to schedule a virtual visit at that time and we can have you drop off a urine sample.   Best, Fabio Bering

## 2019-03-14 ENCOUNTER — Encounter: Payer: Self-pay | Admitting: Physician Assistant

## 2019-03-21 ENCOUNTER — Ambulatory Visit: Payer: 59 | Admitting: General Surgery

## 2019-03-21 ENCOUNTER — Encounter: Payer: Self-pay | Admitting: General Surgery

## 2019-03-21 ENCOUNTER — Other Ambulatory Visit: Payer: Self-pay

## 2019-03-21 VITALS — BP 137/87 | HR 84 | Temp 97.9°F | Resp 14 | Ht 61.0 in | Wt 164.8 lb

## 2019-03-21 DIAGNOSIS — R222 Localized swelling, mass and lump, trunk: Secondary | ICD-10-CM

## 2019-03-21 NOTE — Patient Instructions (Addendum)
Our surgery scheduler will contact you within 24-48 hours to get you scheduled. Please have the BLUE sheet available when she calls to get you scheduled for surgery. If you have any questions or concerns, please give our office a call.   Lipoma  A lipoma is a noncancerous (benign) tumor that is made up of fat cells. This is a very common type of soft-tissue growth. Lipomas are usually found under the skin (subcutaneous). They may occur in any tissue of the body that contains fat. Common areas for lipomas to appear include the back, shoulders, buttocks, and thighs.  Lipomas grow slowly, and they are usually painless. Most lipomas do not cause problems and do not require treatment. What are the causes? The cause of this condition is not known. What increases the risk? You are more likely to develop this condition if:  You are 30-88 years old.  You have a family history of lipomas. What are the signs or symptoms? A lipoma usually appears as a small, round bump under the skin. In most cases, the lump will:  Feel soft or rubbery.  Not cause pain or other symptoms. However, if a lipoma is located in an area where it pushes on nerves, it can become painful or cause other symptoms. How is this diagnosed? A lipoma can usually be diagnosed with a physical exam. You may also have tests to confirm the diagnosis and to rule out other conditions. Tests may include:  Imaging tests, such as a CT scan or MRI.  Removal of a tissue sample to be looked at under a microscope (biopsy). How is this treated? Treatment for this condition depends on the size of the lipoma and whether it is causing any symptoms.  For small lipomas that are not causing problems, no treatment is needed.  If a lipoma is bigger or it causes problems, surgery may be done to remove the lipoma. Lipomas can also be removed to improve appearance. Most often, the procedure is done after applying a medicine that numbs the area (local  anesthetic). Follow these instructions at home:  Watch your lipoma for any changes.  Keep all follow-up visits as told by your health care provider. This is important. Contact a health care provider if:  Your lipoma becomes larger or hard.  Your lipoma becomes painful, red, or increasingly swollen. These could be signs of infection or a more serious condition. Get help right away if:  You develop tingling or numbness in an area near the lipoma. This could indicate that the lipoma is causing nerve damage. Summary  A lipoma is a noncancerous tumor that is made up of fat cells.  Most lipomas do not cause problems and do not require treatment.  If a lipoma is bigger or it causes problems, surgery may be done to remove the lipoma. This information is not intended to replace advice given to you by your health care provider. Make sure you discuss any questions you have with your health care provider. Document Released: 05/20/2002 Document Revised: 05/16/2017 Document Reviewed: 05/16/2017 Elsevier Patient Education  [place].

## 2019-03-22 NOTE — Progress Notes (Signed)
Patient ID: [name], female   DOB: Oct 19, 1970, 48 y.o.   MRN: [id number]  Chief Complaint  Patient presents with  . New Patient (Initial Visit)    lipoma of torso    HPI Lindsay Edwards is a 48 y.o. female.   She has been referred by her primary care provider, Lindsay Collet, PA-C, for surgical evaluation of a left chest wall mass.  Lindsay Edwards states that she noticed a sore lump about a month ago.  She took some ibuprofen and applied both heat and cold packs with little relief.  She says there is been no change in the size since she first noticed the mass.  She states that it is not tender anymore but that she is aware of its presence.  The top of the mass is just adjacent to the bottom of her bra strap and occasionally rubs.  When it is sore, there is no radiation of the pain to any location.  Nothing seems to alleviate or exacerbate the soreness aside from bra strap rubbing.  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   Past Medical History:  Diagnosis Date  . Anxiety   . GERD (gastroesophageal reflux disease)   . PMS (premenstrual syndrome)   . Vertigo    couple mos ago - Dr Lindsay Edwards "fixed the crystals"    Past Surgical History:  Procedure Laterality Date  . BUNIONECTOMY  2018  . CESAREAN SECTION  2005  . ESOPHAGOGASTRODUODENOSCOPY N/A 10/31/2014   Procedure: ESOPHAGOGASTRODUODENOSCOPY (EGD);  Surgeon: Lucilla Lame, MD;  Location: [facility];  Service: Gastroenterology;  Laterality: N/A;  . LAPAROTOMY  2002  . TUMOR EXCISION  2011   throat    Family History  Problem Relation Age of Onset  . Diabetes Maternal Grandmother   . Breast cancer Neg Hx     Social History Social History   Tobacco Use  . Smoking status: Never Smoker  . Smokeless tobacco: Never Used  Substance Use Topics  . Alcohol use: No  . Drug use:  No    Allergies  Allergen Reactions  . Sulfa Antibiotics Rash    Current Outpatient Medications  Medication Sig Dispense Refill  . ALPRAZolam (XANAX) 0.5 MG tablet take 1/2 to 1 tablet by mouth every 6 to 8 hours if needed  0  . citalopram (CELEXA) 10 MG tablet   0  . ondansetron (ZOFRAN) 4 MG tablet Take 1 tablet (4 mg total) by mouth every 8 (eight) hours as needed for nausea or vomiting. 20 tablet 0   No current facility-administered medications for this visit.     Review of Systems Review of Systems  All other systems reviewed and are negative.   Blood pressure 137/87, pulse 84, temperature 97.9 F (36.6 C), temperature source Temporal, resp. rate 14, height 5\' 1"  (1.549 m), weight 164 lb 12.8 oz (74.8 kg), SpO2 98 %.  Physical Exam Physical Exam Constitutional:      General: She is not in acute distress.    Appearance: Normal appearance.  HENT:     Head: Normocephalic and atraumatic.     Nose:     Comments: Covered with a mask secondary to COVID-19 precautions    Mouth/Throat:     Comments: Covered with a mask secondary to COVID-19 precautions Eyes:     General: No scleral  icterus.       Right eye: No discharge.        Left eye: No discharge.     Conjunctiva/sclera: Conjunctivae normal.  Neck:     Musculoskeletal: Normal range of motion and neck supple.     Comments: No thyromegaly or dominant thyroid masses appreciated.  There is a tiny and well faded scar on the left central neck at about level 3, where a small lipoma was removed in the past. Cardiovascular:     Rate and Rhythm: Normal rate and regular rhythm.     Pulses: Normal pulses.  Pulmonary:     Effort: Pulmonary effort is normal.     Breath sounds: Normal breath sounds.  Abdominal:     General: Abdomen is flat. Bowel sounds are normal.     Palpations: Abdomen is soft.  Genitourinary:    Comments: Deferred Musculoskeletal: Normal range of motion.        General: No deformity.       Arms:      Right lower leg: No edema.     Left lower leg: No edema.     Comments: There is a soft well circumscribed mass in the subcutaneous tissues of the left chest, just around the posterior axillary line.  It measures approximately 6 cm in greatest dimension.  It is mobile and nontender.  Lymphadenopathy:     Cervical: No cervical adenopathy.  Skin:    General: Skin is warm and dry.  Neurological:     General: No focal deficit present.     Mental Status: She is alert and oriented to person, place, and time.  Psychiatric:        Mood and Affect: Mood normal.        Behavior: Behavior normal.        Thought Content: Thought content normal.     Data Reviewed I reviewed Lindsay Edwards's note of 03/08/2023 in the mass was identified data for review.  Assessment This is a 48 year old woman who has a history of multiple lipoma removals.  She has a mass on her left torso that feels consistent with another lipoma.  She desires surgical removal.  Plan I have offered her excision of the lipoma.  I discussed the risks of surgery with her.  These include, but are not limited to, bleeding, infection, pain, scarring and/or disfigurement, recurrence.  She would like to proceed with the operation and had the opportunity to ask questions, all of which were answered to her satisfaction.  Our surgery scheduler will contact her.    Fredirick Maudlin 03/22/2019, 2:26 PM

## 2019-03-22 NOTE — H&P (View-Only) (Signed)
Patient ID: Lindsay Edwards, female   DOB: 1970/07/01, 48 y.o.   MRN: [id number]  Chief Complaint  Patient presents with  . New Patient (Initial Visit)    lipoma of torso    HPI Lindsay Edwards is a 48 y.o. female.   She has been referred by her primary care provider, Carles Collet, PA-C, for surgical evaluation of a left chest wall mass.  Ms. Lindsay Edwards states that she noticed a sore lump about a month ago.  She took some ibuprofen and applied both heat and cold packs with little relief.  She says there is been no change in the size since she first noticed the mass.  She states that it is not tender anymore but that she is aware of its presence.  The top of the mass is just adjacent to the bottom of her bra strap and occasionally rubs.  When it is sore, there is no radiation of the pain to any location.  Nothing seems to alleviate or exacerbate the soreness aside from bra strap rubbing.  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   Past Medical History:  Diagnosis Date  . Anxiety   . GERD (gastroesophageal reflux disease)   . PMS (premenstrual syndrome)   . Vertigo    couple mos ago - Dr Tami Ribas "fixed the crystals"    Past Surgical History:  Procedure Laterality Date  . BUNIONECTOMY  2018  . CESAREAN SECTION  2005  . ESOPHAGOGASTRODUODENOSCOPY N/A 10/31/2014   Procedure: ESOPHAGOGASTRODUODENOSCOPY (EGD);  Surgeon: Lucilla Lame, MD;  Location: [facility];  Service: Gastroenterology;  Laterality: N/A;  . LAPAROTOMY  2002  . TUMOR EXCISION  2011   throat    Family History  Problem Relation Age of Onset  . Diabetes Maternal Grandmother   . Breast cancer Neg Hx     Social History Social History   Tobacco Use  . Smoking status: Never Smoker  . Smokeless tobacco: Never Used  Substance Use Topics  . Alcohol use: No  . Drug use:  No    Allergies  Allergen Reactions  . Sulfa Antibiotics Rash    Current Outpatient Medications  Medication Sig Dispense Refill  . ALPRAZolam (XANAX) 0.5 MG tablet take 1/2 to 1 tablet by mouth every 6 to 8 hours if needed  0  . citalopram (CELEXA) 10 MG tablet   0  . ondansetron (ZOFRAN) 4 MG tablet Take 1 tablet (4 mg total) by mouth every 8 (eight) hours as needed for nausea or vomiting. 20 tablet 0   No current facility-administered medications for this visit.     Review of Systems Review of Systems  All other systems reviewed and are negative.   Blood pressure 137/87, pulse 84, temperature 97.9 F (36.6 C), temperature source Temporal, resp. rate 14, height 5\' 1"  (1.549 m), weight 164 lb 12.8 oz (74.8 kg), SpO2 98 %.  Physical Exam Physical Exam Constitutional:      General: She is not in acute distress.    Appearance: Normal appearance.  HENT:     Head: Normocephalic and atraumatic.     Nose:     Comments: Covered with a mask secondary to COVID-19 precautions    Mouth/Throat:     Comments: Covered with a mask secondary to COVID-19 precautions Eyes:     General: No scleral  icterus.       Right eye: No discharge.        Left eye: No discharge.     Conjunctiva/sclera: Conjunctivae normal.  Neck:     Musculoskeletal: Normal range of motion and neck supple.     Comments: No thyromegaly or dominant thyroid masses appreciated.  There is a tiny and well faded scar on the left central neck at about level 3, where a small lipoma was removed in the past. Cardiovascular:     Rate and Rhythm: Normal rate and regular rhythm.     Pulses: Normal pulses.  Pulmonary:     Effort: Pulmonary effort is normal.     Breath sounds: Normal breath sounds.  Abdominal:     General: Abdomen is flat. Bowel sounds are normal.     Palpations: Abdomen is soft.  Genitourinary:    Comments: Deferred Musculoskeletal: Normal range of motion.        General: No deformity.       Arms:      Right lower leg: No edema.     Left lower leg: No edema.     Comments: There is a soft well circumscribed mass in the subcutaneous tissues of the left chest, just around the posterior axillary line.  It measures approximately 6 cm in greatest dimension.  It is mobile and nontender.  Lymphadenopathy:     Cervical: No cervical adenopathy.  Skin:    General: Skin is warm and dry.  Neurological:     General: No focal deficit present.     Mental Status: She is alert and oriented to person, place, and time.  Psychiatric:        Mood and Affect: Mood normal.        Behavior: Behavior normal.        Thought Content: Thought content normal.     Data Reviewed I reviewed [name]'s note of 03/08/2023 in the mass was identified data for review.  Assessment This is a 48 year old woman who has a history of multiple lipoma removals.  She has a mass on her left torso that feels consistent with another lipoma.  She desires surgical removal.  Plan I have offered her excision of the lipoma.  I discussed the risks of surgery with her.  These include, but are not limited to, bleeding, infection, pain, scarring and/or disfigurement, recurrence.  She would like to proceed with the operation and had the opportunity to ask questions, all of which were answered to her satisfaction.  Our surgery scheduler will contact her.    Fredirick Maudlin 03/22/2019, 2:26 PM

## 2019-03-27 ENCOUNTER — Telehealth: Payer: Self-pay | Admitting: General Surgery

## 2019-03-27 NOTE — Telephone Encounter (Signed)
I have called patient to go over surgery info below. No answer. I have left a message on patient's VM to call office back.   Surgery Date: 04/19/19 with Dr Lacretia Leigh of left chest/torso mass.  Preadmission Testing Date: 04/11/19 between 8-1:00pm-phone interview.  Covid Testing Date: 04/16/19 between 8-10:30am.  - patient advised to go to the [facility] ([place])  [employer] Video sent via [employer] [facility] and [employer].  Please make pt aware to call [phone], between 1-3:00pm the day before surgery, to find out what time to arrive.

## 2019-03-27 NOTE — Telephone Encounter (Signed)
Patient called back and is now aware of her instructions for surgery. Patient also aware if she has any questions or concern to please call the office

## 2019-04-11 ENCOUNTER — Encounter
Admission: RE | Admit: 2019-04-11 | Discharge: 2019-04-11 | Disposition: A | Discharge status: Home / Self Care | Payer: 59 | Source: Ambulatory Visit | Attending: General Surgery | Admitting: General Surgery

## 2019-04-11 ENCOUNTER — Other Ambulatory Visit: Payer: Self-pay

## 2019-04-11 DIAGNOSIS — Z01812 Encounter for preprocedural laboratory examination: Secondary | ICD-10-CM | POA: Insufficient documentation

## 2019-04-11 HISTORY — DX: Unspecified osteoarthritis, unspecified site: M19.90

## 2019-04-11 NOTE — Patient Instructions (Signed)
Your procedure is scheduled on: 04-19-19 FRIDAY Report to Same Day Surgery 2nd floor medical mall [facility] Entrance-take elevator on left to 2nd floor.  Check in with surgery information desk.) To find out your arrival time please call [phone] between 1PM - 3PM on 04-18-19 THURSDAY  Remember: Instructions that are not followed completely may result in serious medical risk, up to and including death, or upon the discretion of your surgeon and anesthesiologist your surgery may need to be rescheduled.    _x___ 1. Do not eat food after midnight the night before your procedure. NO GUM OR CANDY AFTER MIDNIGHT. You may drink clear liquids up to 2 hours before you are scheduled to arrive at the hospital for your procedure.  Do not drink clear liquids within 2 hours of your scheduled arrival to the hospital.  Clear liquids include  --Water or Krupinski juice without pulp  --Gatorade  --Black Coffee or Clear Tea (No milk, no creamers, do not add anything to the coffee or Tea   ____Ensure clear carbohydrate drink on the way to the hospital for bariatric patients  ____Ensure clear carbohydrate drink 3 hours before surgery.    __x__ 2. No Alcohol for 24 hours before or after surgery.   __x__3. No Smoking or e-cigarettes for 24 prior to surgery.  Do not use any chewable tobacco products for at least 6 hour prior to surgery   ____  4. Bring all medications with you on the day of surgery if instructed.    __x__ 5. Notify your doctor if there is any change in your medical condition     (cold, fever, infections).    x___6. On the morning of surgery brush your teeth with toothpaste and water.  You may rinse your mouth with mouth wash if you wish.  Do not swallow any toothpaste or mouthwash.   Do not wear jewelry, make-up, hairpins, clips or nail polish.  Do not wear lotions, powders, or perfumes.  Do not shave 48 hours prior to surgery. Men may shave face and neck.  Do not bring valuables to the  hospital.    [facility] is not responsible for any belongings or valuables.               Contacts, dentures or bridgework may not be worn into surgery.  Leave your suitcase in the car. After surgery it may be brought to your room.  For patients admitted to the hospital, discharge time is determined by your  treatment team.  _  Patients discharged the day of surgery will not be allowed to drive home.  You will need someone to drive you home and stay with you the night of your procedure.    Please read over the following fact sheets that you were given:   [facility] Preparing for [facility]  _x___ Take anti-hypertensive listed below, cardiac, seizure, asthma, anti-reflux and psychiatric medicines. These include:  1. YOU MAY TAKE YOUR XANAX IF NEEDED THE MORNING OF SURGERY WITH A SMALL SIP OF WATER   2.  3.  4.  5.  6.  ____Fleets enema or Magnesium Citrate as directed.   _x___ Use CHG Soap or sage wipes as directed on instruction sheet   ____ Use inhalers on the day of surgery and bring to hospital day of surgery  ____ Stop Metformin and Janumet 2 days prior to surgery.    ____ Take 1/2 of usual insulin dose the night before surgery and none on the morning  surgery.   ____ Follow recommendations from Cardiologist, Pulmonologist or PCP regarding stopping Aspirin, Coumadin, Plavix ,Eliquis, Effient, or Pradaxa, and Pletal.  X____Stop Anti-inflammatories such as Advil, Aleve, Ibuprofen, Motrin, Naproxen, Naprosyn, Goodies powders or aspirin products NOW-OK to take Tylenol    ____ Stop supplements until after surgery.   ____ Bring C-Pap to the hospital.

## 2019-04-16 ENCOUNTER — Other Ambulatory Visit
Admission: RE | Admit: 2019-04-16 | Discharge: 2019-04-16 | Disposition: A | Discharge status: Home / Self Care | Payer: 59 | Source: Ambulatory Visit | Attending: General Surgery | Admitting: General Surgery

## 2019-04-16 ENCOUNTER — Other Ambulatory Visit: Payer: Self-pay

## 2019-04-16 DIAGNOSIS — Z01812 Encounter for preprocedural laboratory examination: Secondary | ICD-10-CM | POA: Diagnosis not present

## 2019-04-16 DIAGNOSIS — Z20828 Contact with and (suspected) exposure to other viral communicable diseases: Secondary | ICD-10-CM | POA: Diagnosis not present

## 2019-04-16 LAB — SARS CORONAVIRUS 2 (TAT 6-24 HRS): SARS Coronavirus 2: NEGATIVE

## 2019-04-19 ENCOUNTER — Ambulatory Visit: Payer: 59 | Admitting: Anesthesiology

## 2019-04-19 ENCOUNTER — Encounter
Admission: RE | Disposition: A | Discharge status: Home / Self Care | Payer: Self-pay | Source: Home / Self Care | Attending: General Surgery

## 2019-04-19 ENCOUNTER — Ambulatory Visit
Admission: RE | Admit: 2019-04-19 | Discharge: 2019-04-19 | Disposition: A | Discharge status: Home / Self Care | Payer: 59 | Attending: General Surgery | Admitting: General Surgery

## 2019-04-19 ENCOUNTER — Other Ambulatory Visit: Payer: Self-pay

## 2019-04-19 ENCOUNTER — Encounter: Payer: Self-pay | Admitting: Emergency Medicine

## 2019-04-19 DIAGNOSIS — Z882 Allergy status to sulfonamides status: Secondary | ICD-10-CM | POA: Diagnosis not present

## 2019-04-19 DIAGNOSIS — R222 Localized swelling, mass and lump, trunk: Secondary | ICD-10-CM | POA: Diagnosis not present

## 2019-04-19 DIAGNOSIS — D171 Benign lipomatous neoplasm of skin and subcutaneous tissue of trunk: Secondary | ICD-10-CM | POA: Diagnosis present

## 2019-04-19 DIAGNOSIS — M47896 Other spondylosis, lumbar region: Secondary | ICD-10-CM | POA: Insufficient documentation

## 2019-04-19 DIAGNOSIS — F419 Anxiety disorder, unspecified: Secondary | ICD-10-CM | POA: Insufficient documentation

## 2019-04-19 DIAGNOSIS — D179 Benign lipomatous neoplasm, unspecified: Secondary | ICD-10-CM | POA: Diagnosis not present

## 2019-04-19 DIAGNOSIS — Z79899 Other long term (current) drug therapy: Secondary | ICD-10-CM | POA: Insufficient documentation

## 2019-04-19 HISTORY — PX: MASS EXCISION: SHX2000

## 2019-04-19 SURGERY — EXCISION MASS
Anesthesia: General | Laterality: Left

## 2019-04-19 MED ORDER — BUPIVACAINE HCL 0.25 % IJ SOLN
INTRAMUSCULAR | Status: DC | PRN
  Administered 2019-04-19: 4.5 mL

## 2019-04-19 MED ORDER — CHLORHEXIDINE GLUCONATE CLOTH 2 % EX PADS: 6.0000 | MEDICATED_PAD | Freq: Once | CUTANEOUS | Status: DC

## 2019-04-19 MED ORDER — LIDOCAINE HCL (PF) 2 % IJ SOLN
INTRAMUSCULAR | Status: AC
  Filled 2019-04-19: qty 10

## 2019-04-19 MED ORDER — ONDANSETRON HCL 4 MG/2ML IJ SOLN
INTRAMUSCULAR | Status: AC
  Filled 2019-04-19: qty 2

## 2019-04-19 MED ORDER — PROPOFOL 10 MG/ML IV BOLUS
INTRAVENOUS | Status: AC
  Filled 2019-04-19: qty 20

## 2019-04-19 MED ORDER — BUPIVACAINE LIPOSOME 1.3 % IJ SUSP
INTRAMUSCULAR | Status: DC | PRN
  Administered 2019-04-19: 20 mL

## 2019-04-19 MED ORDER — ONDANSETRON HCL 4 MG/2ML IJ SOLN
INTRAMUSCULAR | Status: DC | PRN
  Administered 2019-04-19: 4 mg via INTRAVENOUS

## 2019-04-19 MED ORDER — FENTANYL CITRATE (PF) 250 MCG/5ML IJ SOLN
INTRAMUSCULAR | Status: AC
  Filled 2019-04-19: qty 5

## 2019-04-19 MED ORDER — PROMETHAZINE HCL 25 MG/ML IJ SOLN: 6.2500 mg | INTRAMUSCULAR | Status: DC | PRN

## 2019-04-19 MED ORDER — BUPIVACAINE LIPOSOME 1.3 % IJ SUSP
INTRAMUSCULAR | Status: AC
  Filled 2019-04-19: qty 20

## 2019-04-19 MED ORDER — FENTANYL CITRATE (PF) 100 MCG/2ML IJ SOLN
INTRAMUSCULAR | Status: DC | PRN
  Administered 2019-04-19: 50 ug via INTRAVENOUS
  Administered 2019-04-19 (×2): 25 ug via INTRAVENOUS

## 2019-04-19 MED ORDER — MIDAZOLAM HCL 2 MG/2ML IJ SOLN
INTRAMUSCULAR | Status: AC
  Filled 2019-04-19: qty 2

## 2019-04-19 MED ORDER — IBUPROFEN 800 MG PO TABS: 800.0000 mg | ORAL_TABLET | Freq: Three times a day (TID) | ORAL | 0 refills | Status: DC | PRN

## 2019-04-19 MED ORDER — PROPOFOL 10 MG/ML IV BOLUS
INTRAVENOUS | Status: DC | PRN
  Administered 2019-04-19: 200 mg via INTRAVENOUS

## 2019-04-19 MED ORDER — ACETAMINOPHEN 500 MG PO TABS
ORAL_TABLET | ORAL | Status: AC
  Administered 2019-04-19: 1000 mg via ORAL
  Filled 2019-04-19: qty 2

## 2019-04-19 MED ORDER — ACETAMINOPHEN 500 MG PO TABS
1000.0000 mg | ORAL_TABLET | ORAL | Status: AC
  Administered 2019-04-19: 07:00:00 1000 mg via ORAL

## 2019-04-19 MED ORDER — LACTATED RINGERS IV SOLN
INTRAVENOUS | Status: DC
  Administered 2019-04-19: 07:00:00 via INTRAVENOUS

## 2019-04-19 MED ORDER — DEXAMETHASONE SODIUM PHOSPHATE 10 MG/ML IJ SOLN
INTRAMUSCULAR | Status: DC | PRN
  Administered 2019-04-19: 10 mg via INTRAVENOUS

## 2019-04-19 MED ORDER — FAMOTIDINE 20 MG PO TABS
ORAL_TABLET | ORAL | Status: AC
  Administered 2019-04-19: 20 mg via ORAL
  Filled 2019-04-19: qty 1

## 2019-04-19 MED ORDER — BUPIVACAINE HCL (PF) 0.25 % IJ SOLN
INTRAMUSCULAR | Status: AC
  Filled 2019-04-19: qty 30

## 2019-04-19 MED ORDER — LIDOCAINE-EPINEPHRINE 1 %-1:100000 IJ SOLN
INTRAMUSCULAR | Status: AC
  Filled 2019-04-19: qty 1

## 2019-04-19 MED ORDER — MIDAZOLAM HCL 5 MG/5ML IJ SOLN
INTRAMUSCULAR | Status: DC | PRN
  Administered 2019-04-19: 2 mg via INTRAVENOUS

## 2019-04-19 MED ORDER — CEFAZOLIN SODIUM-DEXTROSE 2-4 GM/100ML-% IV SOLN
INTRAVENOUS | Status: AC
  Filled 2019-04-19: qty 100

## 2019-04-19 MED ORDER — HYDROCODONE-ACETAMINOPHEN 5-325 MG PO TABS: 1.0000 | ORAL_TABLET | Freq: Four times a day (QID) | ORAL | 0 refills | Status: DC | PRN

## 2019-04-19 MED ORDER — BUPIVACAINE LIPOSOME 1.3 % IJ SUSP: 20.0000 mL | Freq: Once | INTRAMUSCULAR | Status: DC

## 2019-04-19 MED ORDER — LIDOCAINE 2% (20 MG/ML) 5 ML SYRINGE
INTRAMUSCULAR | Status: DC | PRN
  Administered 2019-04-19: 100 mg via INTRAVENOUS

## 2019-04-19 MED ORDER — FAMOTIDINE 20 MG PO TABS
20.0000 mg | ORAL_TABLET | Freq: Once | ORAL | Status: AC
  Administered 2019-04-19: 07:00:00 20 mg via ORAL

## 2019-04-19 MED ORDER — FENTANYL CITRATE (PF) 100 MCG/2ML IJ SOLN: 25.0000 ug | INTRAMUSCULAR | Status: DC | PRN

## 2019-04-19 MED ORDER — LIDOCAINE-EPINEPHRINE 1 %-1:100000 IJ SOLN
INTRAMUSCULAR | Status: DC | PRN
  Administered 2019-04-19: 4.5 mL

## 2019-04-19 MED ORDER — DEXAMETHASONE SODIUM PHOSPHATE 10 MG/ML IJ SOLN
INTRAMUSCULAR | Status: AC
  Filled 2019-04-19: qty 1

## 2019-04-19 MED ORDER — PHENYLEPHRINE 40 MCG/ML (10ML) SYRINGE FOR IV PUSH (FOR BLOOD PRESSURE SUPPORT)
PREFILLED_SYRINGE | INTRAVENOUS | Status: DC | PRN
  Administered 2019-04-19: 80 ug via INTRAVENOUS
  Administered 2019-04-19 (×2): 100 ug via INTRAVENOUS

## 2019-04-19 MED ORDER — CEFAZOLIN SODIUM-DEXTROSE 2-4 GM/100ML-% IV SOLN
2.0000 g | INTRAVENOUS | Status: AC
  Administered 2019-04-19: 2 g via INTRAVENOUS

## 2019-04-19 SURGICAL SUPPLY — 30 items
BLADE SURG 15 STRL LF DISP TIS (BLADE) ×1 IMPLANT
BLADE SURG 15 STRL SS (BLADE) ×1
CANISTER SUCT 1200ML W/VALVE (MISCELLANEOUS) ×2 IMPLANT
CHLORAPREP W/TINT 26 (MISCELLANEOUS) ×2 IMPLANT
COVER WAND RF STERILE (DRAPES) ×2 IMPLANT
DECANTER SPIKE VIAL GLASS SM (MISCELLANEOUS) IMPLANT
DERMABOND ADVANCED (GAUZE/BANDAGES/DRESSINGS) ×1
DERMABOND ADVANCED .7 DNX12 (GAUZE/BANDAGES/DRESSINGS) ×1 IMPLANT
DRAPE LAPAROTOMY 77X122 PED (DRAPES) ×2 IMPLANT
DRAPE MAG INST 16X20 L/F (DRAPES) ×2 IMPLANT
ELECT CAUTERY BLADE TIP 2.5 (TIP) ×2
ELECT REM PT RETURN 9FT ADLT (ELECTROSURGICAL) ×2
ELECTRODE CAUTERY BLDE TIP 2.5 (TIP) ×1 IMPLANT
ELECTRODE REM PT RTRN 9FT ADLT (ELECTROSURGICAL) ×1 IMPLANT
GLOVE BIO SURGEON STRL SZ 6.5 (GLOVE) ×6 IMPLANT
GLOVE INDICATOR 7.0 STRL GRN (GLOVE) ×6 IMPLANT
GOWN STRL REUS W/ TWL LRG LVL3 (GOWN DISPOSABLE) ×3 IMPLANT
GOWN STRL REUS W/TWL LRG LVL3 (GOWN DISPOSABLE) ×3
KIT TURNOVER KIT A (KITS) ×2 IMPLANT
NEEDLE HYPO 25X1 1.5 SAFETY (NEEDLE) ×2 IMPLANT
PACK BASIN MINOR ARMC (MISCELLANEOUS) ×2 IMPLANT
SPONGE LAP 18X18 RF (DISPOSABLE) ×2 IMPLANT
STRIP CLOSURE SKIN 1/2X4 (GAUZE/BANDAGES/DRESSINGS) ×2 IMPLANT
SUT MNCRL 4-0 (SUTURE) ×1
SUT MNCRL 4-0 27XMFL (SUTURE) ×1
SUT VIC AB 3-0 SH 27 (SUTURE) ×1
SUT VIC AB 3-0 SH 27X BRD (SUTURE) ×1 IMPLANT
SUTURE MNCRL 4-0 27XMF (SUTURE) ×1 IMPLANT
SYR 10ML LL (SYRINGE) ×2 IMPLANT
SYR BULB IRRIG 60ML STRL (SYRINGE) ×2 IMPLANT

## 2019-04-19 NOTE — Anesthesia Postprocedure Evaluation (Signed)
Anesthesia Post Note  Patient: Lindsay Edwards  Procedure(s) Performed: EXCISION MASS-excision of left chest/torse mass (Left )  Patient location during evaluation: PACU Anesthesia Type: General Level of consciousness: awake and alert Pain management: pain level controlled Vital Signs Assessment: post-procedure vital signs reviewed and stable Respiratory status: spontaneous breathing, nonlabored ventilation, respiratory function stable and patient connected to nasal cannula oxygen Cardiovascular status: blood pressure returned to baseline and stable Postop Assessment: no apparent nausea or vomiting Anesthetic complications: no     Last Vitals:  Vitals:   04/19/19 0911 04/19/19 0921  BP: 102/67 128/74  Pulse: 90 88  Resp: 17 16  Temp: 36.5 C 36.6 C  SpO2: 94% 96%    Last Pain:  Vitals:   04/19/19 0921  TempSrc: Temporal  PainSc: 0-No pain                 Martha Clan

## 2019-04-19 NOTE — Anesthesia Post-op Follow-up Note (Signed)
Anesthesia QCDR form completed.        

## 2019-04-19 NOTE — Discharge Instructions (Signed)

## 2019-04-19 NOTE — Transfer of Care (Signed)
Immediate Anesthesia Transfer of Care Note  Patient: Lindsay Edwards  Procedure(s) Performed: EXCISION MASS-excision of left chest/torse mass (Left )  Patient Location: PACU  Anesthesia Type:General  Level of Consciousness: awake, alert  and oriented  Airway & Oxygen Therapy: Patient Spontanous Breathing  Post-op Assessment: Report given to RN and Post -op Vital signs reviewed and stable  Post vital signs: Reviewed and stable  Last Vitals:  Vitals Value Taken Time  BP 128/58 04/19/19 0842  Temp    Pulse 105 04/19/19 0842  Resp 18 04/19/19 0842  SpO2 99 % 04/19/19 0842  Vitals shown include unvalidated device data.  Last Pain:  Vitals:   04/19/19 0636  TempSrc: Temporal  PainSc: 0-No pain         Complications: No apparent anesthesia complications

## 2019-04-19 NOTE — Anesthesia Preprocedure Evaluation (Signed)
Anesthesia Evaluation  Patient identified by MRN, date of birth, ID band Patient awake    Reviewed: Allergy & Precautions, H&P , NPO status , Patient's Chart, lab work & pertinent test results, reviewed documented beta blocker date and time   History of Anesthesia Complications Negative for: history of anesthetic complications  Airway Mallampati: III  TM Distance: >3 FB Neck ROM: full    Dental  (+) Dental Advidsory Given, Teeth Intact, Caps   Pulmonary neg pulmonary ROS,    Pulmonary exam normal        Cardiovascular Exercise Tolerance: Good negative cardio ROS Normal cardiovascular exam     Neuro/Psych PSYCHIATRIC DISORDERS Anxiety negative neurological ROS     GI/Hepatic Neg liver ROS, GERD  ,  Endo/Other  negative endocrine ROS  Renal/GU negative Renal ROS  negative genitourinary   Musculoskeletal   Abdominal   Peds  Hematology negative hematology ROS (+)   Anesthesia Other Findings Past Medical History: No date: Anxiety No date: Arthritis     Comment:  lower back No date: GERD (gastroesophageal reflux disease)     Comment:  occ 2016: H. pylori infection No date: PMS (premenstrual syndrome) No date: Vertigo     Comment:  couple mos ago - Dr Tami Ribas "fixed the crystals"   Reproductive/Obstetrics negative OB ROS                             Anesthesia Physical Anesthesia Plan  ASA: II  Anesthesia Plan: General   Post-op Pain Management:    Induction: Intravenous  PONV Risk Score and Plan: 3 and Ondansetron, Dexamethasone, Midazolam and Treatment may vary due to age or medical condition  Airway Management Planned: LMA and Oral ETT  Additional Equipment:   Intra-op Plan:   Post-operative Plan: Extubation in OR  Informed Consent: I have reviewed the patients History and Physical, chart, labs and discussed the procedure including the risks, benefits and alternatives  for the proposed anesthesia with the patient or authorized representative who has indicated his/her understanding and acceptance.     Dental Advisory Given  Plan Discussed with: Anesthesiologist, CRNA and Surgeon  Anesthesia Plan Comments:         Anesthesia Quick Evaluation

## 2019-04-19 NOTE — Anesthesia Procedure Notes (Signed)
Procedure Name: LMA Insertion Date/Time: 04/19/2019 7:58 AM Performed by: Marsh Dolly, CRNA Pre-anesthesia Checklist: Patient identified, Patient being monitored, Timeout performed, Emergency Drugs available and Suction available Patient Re-evaluated:Patient Re-evaluated prior to induction Oxygen Delivery Method: Circle system utilized Preoxygenation: Pre-oxygenation with 100% oxygen Induction Type: IV induction Ventilation: Mask ventilation without difficulty LMA: LMA inserted LMA Size: 4.0 Tube type: Oral Number of attempts: 1 Placement Confirmation: positive ETCO2 and breath sounds checked- equal and bilateral Tube secured with: Tape Dental Injury: Teeth and Oropharynx as per pre-operative assessment

## 2019-04-19 NOTE — Op Note (Signed)
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  The tissue planes were clean and there was no concern for invasion.  Indications: This is a 48 year old woman who has a history of multiple lipomas.  She has 1 on her left chest wall that is bothering her due to irritation from clothing.  She desired surgical removal.  The risks of the operation were discussed with her and she agreed to proceed.  Procedure In Detail: The patient was identified in the preoperative holding area and brought to the operating room where she was placed supine on the OR table.  All bony prominences were padded and bilateral sequential compression devices were placed on the lower extremities.  General anesthesia was induced via a laryngeal mask airway.  The patient was then turned into a slight right lateral decubitus position and her body was supported on a large chest roll.  Her left arm was supported on multiple towels and secured in place with wide silk tape.  The table was then tilted slightly to the right.  She was sterilely prepped and draped in standard fashion.  A timeout was performed confirming the patient's identity, the procedure being performed, her allergies, all necessary equipment was available, and that maintenance anesthesia was adequate.  A perioperative dose of antibiotics was administered.  The skin overlying the lipoma was infiltrated with a one-to-one mixture of 0.25% bupivacaine and 1% lidocaine with epinephrine.  The skin was then sharply incised and the incision carried down through the subcutaneous tissues using electrocautery and blunt dissection.  The mass was  identified and grasped with an Allis clamp.  As I dissected around it using a combination of blunt dissection and electrocautery, identified a finger of the fatty mass that was underneath the chest wall muscles.  The muscle was divided and the portion of the tumor lying Varian was extracted.  The mass was then completely excised.  It was handed off as a specimen.  Undiluted liposomal bupivacaine was infiltrated around the entire surgical site including into the fascial planes.  The wound was then closed in multiple layers.  Deep layers were closed with interrupted 3-0 Vicryl, as was the deep dermis.  The skin was closed with running subcuticular Monocryl.  The skin was cleaned and Dermabond and Steri-Strips were applied.  The patient was turned supine.  She was awakened, extubated, and taken to the postanesthesia care unit in good condition.  EBL: Less than 5 cc  IVF: See anesthesia record  Specimen(s): Left chest wall mass, Intra-Op impression is lipoma  Complications: none immediately apparent.   Counts: all needles, instruments, and sponges were counted and reported to be correct in number at the end of the case.   I was present for and participated in the entire operation.  Fredirick Maudlin 8:44 AM

## 2019-04-19 NOTE — Interval H&P Note (Signed)
History and Physical Interval Note:  04/19/2019 7:13 AM  Lindsay Edwards  has presented today for surgery, with the diagnosis of left chest/torso mass.  The various methods of treatment have been discussed with the patient and family. After consideration of risks, benefits and other options for treatment, the patient has consented to  Procedure(s): EXCISION MASS-excision of left chest/torse mass (Left) as a surgical intervention.  The patient's history has been reviewed, patient examined, no change in status, stable for surgery.  I have reviewed the patient's chart and labs.  Questions were answered to the patient's satisfaction.     Fredirick Maudlin

## 2019-04-22 ENCOUNTER — Telehealth: Payer: Self-pay | Admitting: General Surgery

## 2019-04-22 LAB — SURGICAL PATHOLOGY

## 2019-04-22 NOTE — Telephone Encounter (Signed)
Telephone Triage Questions    Type of surgery?        EXCISION MASS-excision of left chest/torse mass                Date?          04/19/2019                    Physician?    Dr. Celine Ahr    Pain ? No   Nausea, vomiting, Fever, chills? No fever   Any drainage? (Color) no   Does it feel hot to the touch? No    Any Bright redness around the wound/ area? No, pale red    Patient is a little concerned and the wound said it sounded like she had a pocket of fluid at the wound. Please call patient and advise.

## 2019-04-22 NOTE — Telephone Encounter (Signed)
Patient notified that some fluid build up under an excision site is normal. She may use a cold compress to the area. She is aware to call if any signs of infections.

## 2019-04-26 ENCOUNTER — Telehealth: Payer: Self-pay

## 2019-04-26 NOTE — Telephone Encounter (Signed)
  Left message on VM.  Received medication refill for ibuprofen 800 mg from [facility] . Spoke with Dr.Cannon and patient can take 4 OTC  ibupropen. No prescription needed at this time.

## 2019-05-07 ENCOUNTER — Encounter: Payer: Self-pay | Admitting: General Surgery

## 2019-05-07 ENCOUNTER — Encounter: Payer: 59 | Admitting: General Surgery

## 2019-05-07 ENCOUNTER — Other Ambulatory Visit: Payer: Self-pay

## 2019-05-07 ENCOUNTER — Ambulatory Visit (INDEPENDENT_AMBULATORY_CARE_PROVIDER_SITE_OTHER): Payer: 59 | Admitting: General Surgery

## 2019-05-07 VITALS — BP 148/89 | HR 75 | Temp 97.5°F | Resp 14 | Ht 62.0 in | Wt 168.0 lb

## 2019-05-07 DIAGNOSIS — R222 Localized swelling, mass and lump, trunk: Secondary | ICD-10-CM

## 2019-05-07 NOTE — Progress Notes (Signed)
Lindsay Edwards is here today for a postoperative visit.  She underwent resection of an intramuscular lipoma on her left chest wall on 19 April 2019.  Final pathology was consistent with a benign lipoma.  She states that she has done well since the surgery.  She did notice some fluid under the skin a few days after the operation but was reassured by her nursing staff that this is normal and will resolve with time.  She has been applying cocoa butter to the site.  She denies any significant pain.  No fevers or chills.  Today's Vitals   05/07/19 1552  BP: (!) 148/89  Pulse: 75  Resp: 14  Temp: (!) 97.5 F (36.4 C)  TempSrc: Temporal  SpO2: 97%  Weight: 168 lb (76.2 kg)  Height: 5\' 2"  (1.575 m)  PainSc: 0-No pain   Body mass index is 30.73 kg/m. Focused examination: The incision is well approximated.  There is an expected amount of postoperative swelling present.  There is some local skin irritation in a pattern that mimics the placement of the Dermabond and Steri-Strips.  There is no concern for infection.  Impression and plan: This is a 48 year old woman who had a lipoma excised from her left chest wall.  She is doing well.  I reassured her that the skin irritation was likely a local reaction to the adhesives used at the time of her operation.  I reassured her that this was not a true allergy.  She may continue to apply cocoa butter to the area as needed.  She may resume all of her usual activities without restrictions.  I will see her on an as-needed basis.

## 2019-05-07 NOTE — Patient Instructions (Addendum)
   Follow-up with our office as needed.  Please call and ask to speak with a nurse if you develop questions or concerns.  

## 2019-06-12 ENCOUNTER — Telehealth (INDEPENDENT_AMBULATORY_CARE_PROVIDER_SITE_OTHER): Payer: 59 | Admitting: Physician Assistant

## 2019-06-12 DIAGNOSIS — J0100 Acute maxillary sinusitis, unspecified: Secondary | ICD-10-CM | POA: Diagnosis not present

## 2019-06-12 MED ORDER — AMOXICILLIN 875 MG PO TABS: 875.0000 mg | ORAL_TABLET | Freq: Two times a day (BID) | ORAL | 0 refills | Status: AC

## 2019-06-12 NOTE — Progress Notes (Signed)
Patient: Lindsay Edwards Female    DOB: 1970/09/21   48 y.o.   MRN: [id number] Visit Date: 06/12/2019  Today's Provider: Trinna Post, PA-C   Chief Complaint  Patient presents with  . URI   Subjective:    Virtual Visit via Video Note  I connected with Lindsay Edwards on 06/12/19 at  2:40 PM EST by a video enabled telemedicine application and verified that I am speaking with the correct person using two identifiers.  Location: Patient: Home Provider: Office   I discussed the limitations of evaluation and management by telemedicine and the availability of in person appointments. The patient expressed understanding and agreed to proceed.    URI  This is a new problem. The current episode started in the past 7 days. The problem has been unchanged. There has been no fever. Associated symptoms include headaches. Pertinent negatives include no congestion, coughing, rhinorrhea, sinus pain, sneezing, sore throat or wheezing.   Patient reports she has had left sided sinus pain since October after getting COVID tested prior to procedure. COVID testing was negative. Denies fevers, chills, and sick contacts. Has used aleve D, ibuprofen with some relief.  Allergies  Allergen Reactions  . Sulfa Antibiotics Rash     Current Outpatient Medications:  .  ALPRAZolam (XANAX) 0.5 MG tablet, Take 0.5 mg by mouth daily as needed for anxiety. , Disp: , Rfl: 0 .  citalopram (CELEXA) 10 MG tablet, Take 5 mg by mouth at bedtime. , Disp: , Rfl: 0 .  ibuprofen (ADVIL) 800 MG tablet, Take 1 tablet (800 mg total) by mouth every 8 (eight) hours as needed., Disp: 30 tablet, Rfl: 0  Review of Systems  Constitutional: Negative for chills and fever.  HENT: Positive for sinus pressure. Negative for congestion, postnasal drip, rhinorrhea, sinus pain, sneezing and sore throat.   Eyes: Positive for discharge. Negative for redness.  Respiratory: Negative for cough, chest tightness, shortness of  breath and wheezing.   Neurological: Positive for headaches.    Social History   Tobacco Use  . Smoking status: Never Smoker  . Smokeless tobacco: Never Used  Substance Use Topics  . Alcohol use: Yes    Comment: wine monthly      Objective:   There were no vitals taken for this visit. There were no vitals filed for this visit.There is no height or weight on file to calculate BMI.   Physical Exam Constitutional:      Appearance: Normal appearance.  Pulmonary:     Effort: Pulmonary effort is normal. No respiratory distress.  Neurological:     Mental Status: She is alert and oriented to person, place, and time. Mental status is at baseline.  Psychiatric:        Mood and Affect: Mood normal.        Behavior: Behavior normal.      No results found for any visits on 06/12/19.     Assessment & Plan    1. Acute non-recurrent maxillary sinusitis  Treat as below. Start 2nd gen antihistamine daily.   - amoxicillin (AMOXIL) 875 MG tablet; Take 1 tablet (875 mg total) by mouth 2 (two) times daily for 7 days.  Dispense: 14 tablet; Refill: 0  I discussed the assessment and treatment plan with the patient. The patient was provided an opportunity to ask questions and all were answered. The patient agreed with the plan and demonstrated an understanding of the instructions.   The patient was  advised to call back or seek an in-person evaluation if the symptoms worsen or if the condition fails to improve as anticipated.  I provided 15 minutes of non-face-to-face time during this encounter.     Trinna Post, PA-C  [facility]

## 2019-10-18 ENCOUNTER — Ambulatory Visit: Payer: 59 | Attending: Internal Medicine

## 2019-10-18 ENCOUNTER — Telehealth (INDEPENDENT_AMBULATORY_CARE_PROVIDER_SITE_OTHER): Payer: 59 | Admitting: Physician Assistant

## 2019-10-18 DIAGNOSIS — J040 Acute laryngitis: Secondary | ICD-10-CM | POA: Diagnosis not present

## 2019-10-18 DIAGNOSIS — Z20822 Contact with and (suspected) exposure to covid-19: Secondary | ICD-10-CM

## 2019-10-18 MED ORDER — PREDNISONE 10 MG PO TABS: ORAL_TABLET | ORAL | 0 refills | Status: DC

## 2019-10-18 NOTE — Progress Notes (Addendum)
Established patient visit   Patient: Lindsay Edwards   DOB: 1971/02/21   49 y.o. Female  MRN: [id number] Visit Date: 10/18/2019  Today's healthcare provider: Trinna Post, PA-C   Chief Complaint  Patient presents with  . Sinus Problem  I,Jannelly Bergren M Arkeem Harts,acting as a scribe for Trinna Post, PA-C.,have documented all relevant documentation on the behalf of Trinna Post, PA-C,as directed by  Trinna Post, PA-C while in the presence of Trinna Post, PA-C.  Virtual Visit via Video Note   This visit type was conducted due to national recommendations for restrictions regarding the COVID-19 Pandemic (e.g. social distancing) in an effort to limit this patient's exposure and mitigate transmission in our community. This patient is at least at moderate risk for complications without adequate follow up. This format is felt to be most appropriate for this patient at this time. Physical exam was limited by quality of the video and audio technology used for the visit.   Patient location: Home Provider location: Office  Subjective    Sinus Problem This is a new problem. The current episode started in the past 7 days. The problem has been gradually worsening since onset. There has been no fever. Her pain is at a severity of 0/10. She is experiencing no pain. Associated symptoms include congestion, coughing, a hoarse voice and sinus pressure. Pertinent negatives include no chills, ear pain, headaches, shortness of breath, sneezing or sore throat. Treatments tried: Claritin and Zyrtec. The treatment provided mild relief.   Reports she has been dealing with allergy for one month. She is having a hoarse voice. She reports this has been worsening in the past two weeks. Denies sore throat, has a dry throat. She has tried antihistamines. Reports left ear itches. No headache. Not vaccinated for COVID.      Medications: Outpatient Medications Prior to Visit  Medication Sig  .  ALPRAZolam (XANAX) 0.5 MG tablet Take 0.5 mg by mouth daily as needed for anxiety.   . citalopram (CELEXA) 10 MG tablet Take 5 mg by mouth at bedtime.   Marland Kitchen ibuprofen (ADVIL) 800 MG tablet Take 1 tablet (800 mg total) by mouth every 8 (eight) hours as needed.   No facility-administered medications prior to visit.    Review of Systems  Constitutional: Negative for appetite change, chills, fatigue and fever.  HENT: Positive for congestion, hoarse voice, postnasal drip, rhinorrhea, sinus pressure, sinus pain and voice change. Negative for ear pain, sneezing and sore throat.   Eyes:       Watery eyes  Respiratory: Positive for cough. Negative for shortness of breath.   Neurological: Negative for headaches.      Objective    There were no vitals taken for this visit.   Physical Exam    No results found for any visits on 10/18/19.  Assessment & Plan    1. Laryngitis  - symptoms and exam c/w viral URI  - no evidence of strep pharyngitis, CAP, AOM, bacterial sinusitis, or other bacterial infection - concern for possible COVID19 infection - will send for outpatient testing - discussed need to quarantine 10 days from start of symptoms and until fever-free for at least 48 hours - discussed need to quarantine household members - discussed symptomatic management, natural course, and return precautions  - predniSONE (DELTASONE) 10 MG tablet; Take 6 pills on day 1, 5 pills on day 2 and so on until complete.  Dispense: 21 tablet; Refill: 0  No follow-ups on file.         Paulene Floor  [facility] [phone] (phone) [phone] (fax)  [place]

## 2019-10-18 NOTE — Patient Instructions (Signed)
Laryngitis °Laryngitis is irritation and swelling (inflammation) of your vocal cords. This condition causes symptoms such as: °· A change in your voice. It may sound low and hoarse. °· Loss of voice. °· Coughing. °· Sore throat. °· Dry throat. °· Stuffy nose. °Depending on the cause, this condition may go away after a short time or may last for more than 3 weeks. Treatment often involves resting your voice and using medicines to soothe your throat. °Follow these instructions at home: °Medicines °· Take over-the-counter and prescription medicines only as told by your doctor. °· If you were prescribed an antibiotic medicine, take it as told by your doctor. Do not stop taking it even if you start to feel better. °General instructions °· Talk as little as possible. Also avoid whispering. °· Write instead of talking. Do this until your voice is back to normal. °· Drink enough fluid to keep your pee (urine) pale yellow. °· Breathe in moist air. Use a humidifier if you live in a dry climate. °· Do not use any products that have nicotine or tobacco in them, such as cigarettes and e-cigarettes. If you need help quitting, ask your doctor. °Contact a doctor if: °· You have a fever. °· Your pain is worse. °· Your symptoms do not get better in 2 weeks. °Get help right away if: °· You cough up blood. °· You have trouble swallowing. °· You have trouble breathing. °Summary °· Laryngitis is inflammation of your vocal cords. °· This condition causes your voice to sound low and hoarse. °· Rest your voice by talking as little as possible. Also avoid whispering. °This information is not intended to replace advice given to you by your health care provider. Make sure you discuss any questions you have with your health care provider. °Document Revised: 05/17/2017 Document Reviewed: 05/17/2017 °Elsevier Patient Education © 2020 [employer] ° °

## 2019-10-19 LAB — SARS-COV-2, NAA 2 DAY TAT

## 2019-10-19 LAB — NOVEL CORONAVIRUS, NAA: SARS-CoV-2, NAA: NOT DETECTED

## 2019-11-06 ENCOUNTER — Other Ambulatory Visit: Payer: Self-pay | Admitting: Obstetrics and Gynecology

## 2019-11-06 DIAGNOSIS — Z1231 Encounter for screening mammogram for malignant neoplasm of breast: Secondary | ICD-10-CM

## 2019-11-08 ENCOUNTER — Ambulatory Visit
Admission: RE | Admit: 2019-11-08 | Discharge: 2019-11-08 | Disposition: A | Discharge status: Home / Self Care | Payer: 59 | Source: Ambulatory Visit | Attending: Obstetrics and Gynecology | Admitting: Obstetrics and Gynecology

## 2019-11-08 DIAGNOSIS — Z1231 Encounter for screening mammogram for malignant neoplasm of breast: Secondary | ICD-10-CM

## 2020-07-06 ENCOUNTER — Other Ambulatory Visit: Payer: 59

## 2020-12-01 ENCOUNTER — Other Ambulatory Visit: Payer: Self-pay

## 2020-12-01 ENCOUNTER — Encounter: Payer: Self-pay | Admitting: Podiatry

## 2020-12-01 ENCOUNTER — Ambulatory Visit (INDEPENDENT_AMBULATORY_CARE_PROVIDER_SITE_OTHER): Payer: 59

## 2020-12-01 ENCOUNTER — Ambulatory Visit: Payer: 59 | Admitting: Podiatry

## 2020-12-01 DIAGNOSIS — M7752 Other enthesopathy of left foot: Secondary | ICD-10-CM

## 2020-12-01 DIAGNOSIS — M216X2 Other acquired deformities of left foot: Secondary | ICD-10-CM

## 2020-12-01 MED ORDER — BETAMETHASONE SOD PHOS & ACET 6 (3-3) MG/ML IJ SUSP
3.0000 mg | Freq: Once | INTRAMUSCULAR | Status: AC
  Administered 2020-12-01: 3 mg via INTRA_ARTICULAR

## 2020-12-01 MED ORDER — MELOXICAM 15 MG PO TABS
15.0000 mg | ORAL_TABLET | Freq: Every day | ORAL | 1 refills | Status: DC
Start: 2020-12-01 — End: 2023-12-22

## 2020-12-01 NOTE — Progress Notes (Signed)
   HPI: 50 y.o. female presenting today established to the practice presenting for a new complaint today regarding left forefoot pain that is been going on for approximately 1 month now.  Aggravated by walking.  She denies a history of injury.  Gradual onset.  Over the last few weeks it is slowly increased in pain.  She has not done anything for treatment.  She presents for further treatment and evaluation  Past Medical History:  Diagnosis Date   Anxiety    Arthritis    lower back   GERD (gastroesophageal reflux disease)    occ   H. pylori infection 2016   PMS (premenstrual syndrome)    Vertigo    couple mos ago - Dr Tami Ribas "fixed the crystals"     Physical Exam: General: The patient is alert and oriented x3 in no acute distress.  Dermatology: Skin is warm, dry and supple bilateral lower extremities. Negative for open lesions or macerations.  Vascular: Palpable pedal pulses bilaterally. No edema or erythema noted. Capillary refill within normal limits.  Neurological: Epicritic and protective threshold grossly intact bilaterally.   Musculoskeletal Exam: History of bunionectomies bilateral.  Pain on palpation range of motion noted to the second MTPJ of the left foot  Radiographic Exam:  Normal osseous mineralization. Joint spaces preserved. No fracture/dislocation/boney destruction.    Assessment: 1.  Second MTPJ capsulitis left   Plan of Care:  1. Patient evaluated. X-Rays reviewed.  2.  Injection of 0.5 cc Celestone Soluspan injection into the second MTPJ left 3.  Prescription for meloxicam 15 mg daily.  Patient declined a Medrol Dosepak 4.  Postsurgical shoe dispensed.  Weightbearing as tolerated 5.  Return to clinic in 3 weeks with Dr. Milinda Pointer      Edrick Kins, DPM [facility]  Dr. Edrick Kins, DPM    [address], [place] [address]                Office [phone]  Fax [phone]

## 2020-12-16 ENCOUNTER — Other Ambulatory Visit: Payer: Self-pay | Admitting: Obstetrics and Gynecology

## 2020-12-16 DIAGNOSIS — Z1231 Encounter for screening mammogram for malignant neoplasm of breast: Secondary | ICD-10-CM

## 2021-01-01 ENCOUNTER — Other Ambulatory Visit: Payer: Self-pay

## 2021-01-01 ENCOUNTER — Ambulatory Visit
Admission: RE | Admit: 2021-01-01 | Discharge: 2021-01-01 | Disposition: A | Discharge status: Home / Self Care | Payer: 59 | Source: Ambulatory Visit | Attending: Obstetrics and Gynecology | Admitting: Obstetrics and Gynecology

## 2021-01-01 DIAGNOSIS — Z1231 Encounter for screening mammogram for malignant neoplasm of breast: Secondary | ICD-10-CM | POA: Insufficient documentation

## 2021-01-11 ENCOUNTER — Ambulatory Visit: Payer: 59 | Admitting: Podiatry

## 2021-03-03 ENCOUNTER — Telehealth: Payer: Self-pay

## 2021-03-03 NOTE — Telephone Encounter (Signed)
Tally Joe had a few openings the first week of October.  Left message for pt to call back so we can schedule her.  PEC please schedule pt if she calls back.   Thanks,   -Mickel Baas

## 2021-03-03 NOTE — Telephone Encounter (Signed)
Copied from [place] [phone]. Topic: Appointment Scheduling - Scheduling Inquiry for Clinic >> Mar 03, 2021  2:43 PM Pawlus, Brayton Layman A wrote: Reason for CRM: Pt was upset that she was not able to get an appt until 10/10 for her back pain, please advise if she can be worked in sooner.

## 2021-03-12 ENCOUNTER — Encounter: Payer: Self-pay | Admitting: General Surgery

## 2021-03-22 ENCOUNTER — Ambulatory Visit: Payer: 59 | Admitting: Family Medicine

## 2021-12-13 LAB — COLOGUARD
COLOGUARD: NEGATIVE
COLOGUARD: NEGATIVE

## 2022-02-03 ENCOUNTER — Other Ambulatory Visit: Payer: Self-pay | Admitting: Obstetrics and Gynecology

## 2022-02-03 DIAGNOSIS — Z1231 Encounter for screening mammogram for malignant neoplasm of breast: Secondary | ICD-10-CM

## 2022-02-25 ENCOUNTER — Ambulatory Visit
Admission: RE | Admit: 2022-02-25 | Discharge: 2022-02-25 | Disposition: A | Discharge status: Home / Self Care | Payer: 59 | Source: Ambulatory Visit | Attending: Obstetrics and Gynecology | Admitting: Obstetrics and Gynecology

## 2022-02-25 DIAGNOSIS — Z1231 Encounter for screening mammogram for malignant neoplasm of breast: Secondary | ICD-10-CM

## 2022-03-01 ENCOUNTER — Other Ambulatory Visit: Payer: Self-pay | Admitting: Obstetrics and Gynecology

## 2022-03-02 ENCOUNTER — Other Ambulatory Visit: Payer: Self-pay | Admitting: Obstetrics and Gynecology

## 2022-03-02 DIAGNOSIS — N6489 Other specified disorders of breast: Secondary | ICD-10-CM

## 2022-03-02 DIAGNOSIS — R928 Other abnormal and inconclusive findings on diagnostic imaging of breast: Secondary | ICD-10-CM

## 2022-03-03 ENCOUNTER — Ambulatory Visit
Admission: RE | Admit: 2022-03-03 | Discharge: 2022-03-03 | Disposition: A | Discharge status: Home / Self Care | Payer: 59 | Source: Ambulatory Visit | Attending: Obstetrics and Gynecology | Admitting: Obstetrics and Gynecology

## 2022-03-03 DIAGNOSIS — R928 Other abnormal and inconclusive findings on diagnostic imaging of breast: Secondary | ICD-10-CM | POA: Insufficient documentation

## 2022-03-03 DIAGNOSIS — N6489 Other specified disorders of breast: Secondary | ICD-10-CM | POA: Diagnosis present

## 2022-03-04 ENCOUNTER — Other Ambulatory Visit: Payer: Self-pay | Admitting: Obstetrics and Gynecology

## 2022-03-04 DIAGNOSIS — R928 Other abnormal and inconclusive findings on diagnostic imaging of breast: Secondary | ICD-10-CM

## 2022-03-04 DIAGNOSIS — N63 Unspecified lump in unspecified breast: Secondary | ICD-10-CM

## 2022-03-15 ENCOUNTER — Other Ambulatory Visit: Payer: 59

## 2022-03-16 ENCOUNTER — Ambulatory Visit
Admission: RE | Admit: 2022-03-16 | Discharge: 2022-03-16 | Disposition: A | Discharge status: Home / Self Care | Payer: 59 | Source: Ambulatory Visit | Attending: Obstetrics and Gynecology | Admitting: Obstetrics and Gynecology

## 2022-03-16 DIAGNOSIS — R928 Other abnormal and inconclusive findings on diagnostic imaging of breast: Secondary | ICD-10-CM | POA: Diagnosis present

## 2022-03-16 DIAGNOSIS — N63 Unspecified lump in unspecified breast: Secondary | ICD-10-CM | POA: Insufficient documentation

## 2022-03-16 HISTORY — PX: BREAST BIOPSY: SHX20

## 2022-03-17 ENCOUNTER — Encounter: Payer: Self-pay | Admitting: *Deleted

## 2022-03-17 LAB — SURGICAL PATHOLOGY

## 2022-03-17 NOTE — Progress Notes (Signed)
Referral recieved from [facility] for benign breast mass.  Appointment scheduled for surgical consultation with Dr. Bary Castilla on 03/29/22 at 1:30.  Appt. Details given to Ms. Sass.  No further needs at this time.

## 2023-02-18 ENCOUNTER — Ambulatory Visit
Admission: RE | Admit: 2023-02-18 | Discharge: 2023-02-18 | Disposition: A | Discharge status: Home / Self Care | Payer: 59 | Source: Ambulatory Visit | Attending: Family Medicine | Admitting: Family Medicine

## 2023-02-18 VITALS — BP 129/83 | HR 94 | Temp 98.0°F | Resp 18

## 2023-02-18 DIAGNOSIS — N3000 Acute cystitis without hematuria: Secondary | ICD-10-CM | POA: Insufficient documentation

## 2023-02-18 LAB — POCT URINALYSIS DIP (MANUAL ENTRY)
Bilirubin, UA: NEGATIVE
Glucose, UA: NEGATIVE mg/dL
Ketones, POC UA: NEGATIVE mg/dL
Nitrite, UA: NEGATIVE
Protein Ur, POC: NEGATIVE mg/dL
Spec Grav, UA: 1.015 (ref 1.010–1.025)
Urobilinogen, UA: 0.2 U/dL
pH, UA: 7 (ref 5.0–8.0)

## 2023-02-18 MED ORDER — NITROFURANTOIN MONOHYD MACRO 100 MG PO CAPS: 100.0000 mg | ORAL_CAPSULE | Freq: Two times a day (BID) | ORAL | 0 refills | Status: DC

## 2023-02-18 NOTE — ED Provider Notes (Signed)
Lindsay Edwards    CSN: [id number] Arrival date & time: 02/18/23  1354      History   Chief Complaint Chief Complaint  Patient presents with   Urinary Frequency    I think it's a urinary tract infection. - Entered by patient    HPI Lindsay Edwards is a 52 y.o. female.    Urinary Frequency  [name] is a 52 y.o. female presents for evaluation of urinary frequency, urgency and dysuria x 2 days, without flank pain, fever, chills, or abnormal vaginal discharge or bleeding. Uncertain of prior urine pathology. No LMP recorded. Patient has had an ablation.  Past Medical History:  Diagnosis Date   Anxiety    Arthritis    lower back   GERD (gastroesophageal reflux disease)    occ   H. pylori infection 2016   PMS (premenstrual syndrome)    Vertigo    couple mos ago - Dr Jenne Campus "fixed the crystals"    Patient Active Problem List   Diagnosis Date Noted   Mass of left chest wall    Overweight (BMI 25.0-29.9) 09/09/2017   Chronic anxiety 08/19/2015   Allergic rhinitis 04/01/2015   Helicobacter pylori gastritis 11/21/2014    Past Surgical History:  Procedure Laterality Date   BREAST BIOPSY Right 03/16/2022   US biopsy/ heart clip/ path pending   BUNIONECTOMY  2018   x2   CESAREAN SECTION  2005   ESOPHAGOGASTRODUODENOSCOPY N/A 10/31/2014   Procedure: ESOPHAGOGASTRODUODENOSCOPY (EGD);  Surgeon: Midge Minium, MD;  Location: [facility];  Service: Gastroenterology;  Laterality: N/A;   LAPAROTOMY  2002   MASS EXCISION Left 04/19/2019   Procedure: EXCISION MASS-excision of left chest/torse mass;  Surgeon: Duanne Guess, MD;  Location: [facility];  Service: General;  Laterality: Left;   TUMOR EXCISION  2011   throat    OB History   No obstetric history on file.      Home Medications    Prior to Admission medications   Medication Sig Start Date End Date Taking? Authorizing Provider  ALPRAZolam Prudy Feeler) 0.5 MG tablet Take 0.5 mg by mouth at  bedtime as needed for anxiety.   Yes [provider]  citalopram (CELEXA) 10 MG tablet Take 5 mg by mouth at bedtime.  02/05/17  Yes [provider]  citalopram (CELEXA) 10 MG tablet Take 10 mg by mouth daily.   Yes [provider]  nitrofurantoin, macrocrystal-monohydrate, (MACROBID) 100 MG capsule Take 1 capsule (100 mg total) by mouth 2 (two) times daily. 02/18/23  Yes Bing Neighbors, NP  ALPRAZolam Prudy Feeler) 0.5 MG tablet Take 0.5 mg by mouth daily as needed for anxiety.  11/23/16   [provider]  ibuprofen (ADVIL) 800 MG tablet Take 1 tablet (800 mg total) by mouth every 8 (eight) hours as needed. 04/19/19   Duanne Guess, MD  meloxicam (MOBIC) 15 MG tablet Take 1 tablet (15 mg total) by mouth daily. 12/01/20   Felecia Shelling, DPM  predniSONE (DELTASONE) 10 MG tablet Take 6 pills on day 1, 5 pills on day 2 and so on until complete. 10/18/19   Trey Sailors, PA-C    Family History Family History  Problem Relation Age of Onset   Diabetes Maternal Grandmother    Breast cancer Neg Hx     Social History Social History   Tobacco Use   Smoking status: Never   Smokeless tobacco: Never  Vaping Use   Vaping status: Never Used  Substance  Use Topics   Alcohol use: Not Currently    Comment: wine monthly   Drug use: No     Allergies   Sulfa antibiotics   Review of Systems Review of Systems  Genitourinary:  Positive for frequency.     Physical Exam Triage Vital Signs ED Triage Vitals  Encounter Vitals Group     BP 02/18/23 1422 129/83     Systolic BP Percentile --      Diastolic BP Percentile --      Pulse Rate 02/18/23 1422 94     Resp 02/18/23 1422 18     Temp 02/18/23 1422 98 F (36.7 C)     Temp src --      SpO2 02/18/23 1422 98 %     Weight --      Height --      Head Circumference --      Peak Flow --      Pain Score 02/18/23 1414 0     Pain Loc --      Pain Education --      Exclude from Growth Chart --   Updated Vital  Signs BP 129/83   Pulse 94   Temp 98 F (36.7 C)   Resp 18   SpO2 98%   Visual Acuity Right Eye Distance:   Left Eye Distance:   Bilateral Distance:    Right Eye Near:   Left Eye Near:    Bilateral Near:     Physical Exam General appearance: Alert, well developed, well nourished, cooperative  Head: Normocephalic, without obvious abnormality, atraumatic Heart: Rate and rhythm normal.   Respiratory: Respirations even and unlabored, normal respiratory rate CVA:  no flank pain Extremities: No gross deformities Skin: Skin color, texture, turgor normal. No rashes seen  Psych: Appropriate mood and affect.   UC Treatments / Results  Labs (all labs ordered are listed, but only abnormal results are displayed) Labs Reviewed  POCT URINALYSIS DIP (MANUAL ENTRY) - Abnormal; Notable for the following components:      Result Value   Clarity, UA turbid (*)    Blood, UA moderate (*)    Leukocytes, UA Small (1+) (*)    All other components within normal limits  URINE CULTURE    EKG   Radiology No results found.  Procedures Procedures (including critical care time)  Medications Ordered in UC Medications - No data to display  Initial Impression / Assessment and Plan / UC Course  I have reviewed the triage vital signs and the nursing notes.  Pertinent labs & imaging results that were available during my care of the patient were reviewed by me and considered in my medical decision making (see chart for details).      UA abnormal and findings consistent with UTI. Empiric antibiotic treatment initiated.  Encouraged increase intake of water. . Urine culture pending.  ER if symptoms become severe. Follow-up with PCP if symptoms do not completely resolve.  Final Clinical Impressions(s) / UC Diagnoses   Final diagnoses:  Acute cystitis without hematuria     Discharge Instructions      Your urine culture will result within 3 days.  We will notify you if there is any changes  in treatment based on urine culture results.  If you do not hear anything from our clinic continue with current treatment as prescribed.  Hydrate well with fluids.  If you develop any fever, nausea or vomiting or severe abdominal pain go immediately to the emergency department.  ED Prescriptions     Medication Sig Dispense Auth. Provider   nitrofurantoin, macrocrystal-monohydrate, (MACROBID) 100 MG capsule Take 1 capsule (100 mg total) by mouth 2 (two) times daily. 10 capsule Bing Neighbors, NP      PDMP not reviewed this encounter.   Bing Neighbors, NP 02/18/23 [phone]

## 2023-02-18 NOTE — Discharge Instructions (Signed)
Your urine culture will result within 3 days.  We will notify you if there is any changes in treatment based on urine culture results.  If you do not hear anything from our clinic continue with current treatment as prescribed.  Hydrate well with fluids.  If you develop any fever, nausea or vomiting or severe abdominal pain go immediately to the emergency department.

## 2023-02-18 NOTE — ED Triage Notes (Signed)
Patient to Urgent Care with complaints of urinary frequency/ pressure/ malodorous urine that started yesterday.   Denies any fevers.

## 2023-02-20 ENCOUNTER — Telehealth: Payer: Self-pay

## 2023-02-20 LAB — URINE CULTURE
Culture: >=100000 — AB
Special Requests: NORMAL

## 2023-02-20 MED ORDER — CEPHALEXIN 500 MG PO CAPS: 500.0000 mg | ORAL_CAPSULE | Freq: Two times a day (BID) | ORAL | 0 refills | Status: AC

## 2023-07-24 ENCOUNTER — Ambulatory Visit: Payer: 59

## 2023-11-17 ENCOUNTER — Other Ambulatory Visit: Payer: Self-pay | Admitting: Obstetrics

## 2023-11-17 ENCOUNTER — Encounter: Payer: Self-pay | Admitting: Family Medicine

## 2023-11-17 DIAGNOSIS — Z1231 Encounter for screening mammogram for malignant neoplasm of breast: Secondary | ICD-10-CM

## 2023-11-21 ENCOUNTER — Other Ambulatory Visit: Payer: Self-pay | Admitting: Obstetrics

## 2023-11-21 DIAGNOSIS — N6489 Other specified disorders of breast: Secondary | ICD-10-CM

## 2023-12-22 ENCOUNTER — Ambulatory Visit (INDEPENDENT_AMBULATORY_CARE_PROVIDER_SITE_OTHER): Admitting: Family Medicine

## 2023-12-22 ENCOUNTER — Encounter: Payer: Self-pay | Admitting: Family Medicine

## 2023-12-22 VITALS — BP 135/85 | HR 78 | Temp 98.2°F | Ht 62.0 in | Wt 157.4 lb

## 2023-12-22 DIAGNOSIS — E663 Overweight: Secondary | ICD-10-CM

## 2023-12-22 DIAGNOSIS — D171 Benign lipomatous neoplasm of skin and subcutaneous tissue of trunk: Secondary | ICD-10-CM | POA: Diagnosis not present

## 2023-12-22 DIAGNOSIS — F419 Anxiety disorder, unspecified: Secondary | ICD-10-CM | POA: Diagnosis not present

## 2023-12-22 DIAGNOSIS — R5383 Other fatigue: Secondary | ICD-10-CM

## 2023-12-22 DIAGNOSIS — Z7689 Persons encountering health services in other specified circumstances: Secondary | ICD-10-CM

## 2023-12-22 DIAGNOSIS — Z23 Encounter for immunization: Secondary | ICD-10-CM | POA: Diagnosis not present

## 2023-12-22 DIAGNOSIS — R030 Elevated blood-pressure reading, without diagnosis of hypertension: Secondary | ICD-10-CM

## 2023-12-22 DIAGNOSIS — Z833 Family history of diabetes mellitus: Secondary | ICD-10-CM

## 2023-12-22 DIAGNOSIS — Z1159 Encounter for screening for other viral diseases: Secondary | ICD-10-CM

## 2023-12-22 NOTE — Progress Notes (Signed)
 "  New Patient Office Visit  Introduced to nurse practitioner role and practice setting.  All questions answered.  Discussed provider/patient relationship and expectations.  Subjective    Patient ID: Lindsay Edwards, female    DOB: Aug 08, 1970  Age: 53 y.o. MRN: [id number]  CC:  Chief Complaint  Patient presents with   New Patient (Initial Visit)    Needs to be established with a PCP.  Stays tired all the time so is requesting routine blood work.  Discuss weight loss options.  Colonoscopy- done Cologuard last year  Informed patient the vaccines that were due and the screenings also.   HPI  Discussed the use of AI scribe software for clinical note transcription with the patient, who gave verbal consent to proceed.  History of Present Illness Lindsay Edwards is a 53 year old female who presents to establish primary care and for routine lab work.  Lindsay Edwards has a history of gastric reflux, currently not flaring up, with occasional burping when consuming acidic foods. Lindsay Edwards manages symptoms with over-the-counter medications like Tums or Gas-X. Lindsay Edwards was previously diagnosed with H. pylori, which was treated successfully.  Lindsay Edwards has a history of vertigo that occurred several years ago and has not been a recent issue.  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 Lindsay Edwards takes Celexa 20 mg daily and uses Xanax as needed, approximately once every two to three weeks, also as an aid to sleep due to her husbands job.  Lindsay Edwards has a history of arthritis and bone spurs, causing lower back pain, and occasionally takes Advil  for inflammation.  Lindsay Edwards has undergone several surgeries, including a breast biopsy, bilateral bunion removal, a C-section, and tumor removals from her throat and chest area. Lindsay Edwards currently has two lipomas L anterior thigh and LUQ.  Lindsay Edwards is experiencing menopausal symptoms,  including hot flashes that began in March. These symptoms have recently returned but are not as severe as before. Lindsay Edwards manages them with a desk fan and celexa.  Her family history includes her mother having diabetes and a recent diagnosis of kidney cancer, which was treated successfully. Lindsay Edwards has not been screened for diabetes before and reports no symptoms such as increased thirst, urination, or vision changes.  Lindsay Edwards is concerned about her diet, particularly her carbohydrate intake, and has previously used phentermine  for weight loss. Lindsay Edwards is exploring other dietary options. No increased drinking, urination, or vision changes. Reports feeling tired often, possibly due to anemia or vitamin deficiency.   Outpatient Encounter Medications as of 12/22/2023  Medication Sig   ALPRAZolam (XANAX) 0.5 MG tablet Take 0.5 mg by mouth daily as needed for anxiety.    busPIRone  (BUSPAR ) 5 MG tablet Take 1 tablet (5 mg total) by mouth 2 (two) times daily.   citalopram (CELEXA) 10 MG tablet Take 5 mg by mouth at bedtime.  (Patient taking differently: Take 20 mg by mouth at bedtime.)   hydrOXYzine  (ATARAX ) 10 MG tablet Take 1 tablet (10 mg total) by mouth 3 (three) times daily as needed.   [DISCONTINUED] ALPRAZolam (XANAX) 0.5 MG tablet Take 0.5 mg by mouth at bedtime as needed for anxiety. (Patient not taking: Reported on 12/22/2023)   [DISCONTINUED] citalopram (CELEXA) 10 MG tablet Take 10 mg by mouth daily. (Patient not taking: Reported on 12/22/2023)   [DISCONTINUED] ibuprofen  (ADVIL ) 800 MG tablet Take 1 tablet (800 mg total) by mouth every 8 (eight)  hours as needed.   [DISCONTINUED] meloxicam  (MOBIC ) 15 MG tablet Take 1 tablet (15 mg total) by mouth daily.   [DISCONTINUED] nitrofurantoin , macrocrystal-monohydrate, (MACROBID ) 100 MG capsule Take 1 capsule (100 mg total) by mouth 2 (two) times daily.   [DISCONTINUED] predniSONE  (DELTASONE ) 10 MG tablet Take 6 pills on day 1, 5 pills on day 2 and so on until complete.    No facility-administered encounter medications on file as of 12/22/2023.    Past Medical History:  Diagnosis Date   Anxiety    Arthritis    lower back   GERD (gastroesophageal reflux disease)    occ   H. pylori infection 2016   PMS (premenstrual syndrome)    Vertigo    couple mos ago - Dr Herminio fixed the crystals    Past Surgical History:  Procedure Laterality Date   BREAST BIOPSY Right 03/16/2022   us  biopsy/ heart clip/ path pending   BUNIONECTOMY  2018   x2   CESAREAN SECTION  2005   ESOPHAGOGASTRODUODENOSCOPY N/A 10/31/2014   Procedure: ESOPHAGOGASTRODUODENOSCOPY (EGD);  Surgeon: Rogelia Copping, MD;  Location: [facility];  Service: Gastroenterology;  Laterality: N/A;   LAPAROTOMY  2002   MASS EXCISION Left 04/19/2019   Procedure: EXCISION MASS-excision of left chest/torse mass;  Surgeon: Marolyn Nest, MD;  Location: [facility];  Service: General;  Laterality: Left;   TUMOR EXCISION  2011   throat    Family History  Problem Relation Age of Onset   Diabetes Mother    Cancer Mother        Kidney   Diabetes Maternal Grandmother    Breast cancer Neg Hx     Social History   Socioeconomic History   Marital status: Married    Spouse name: Not on file   Number of children: Not on file   Years of education: Not on file   Highest education level: Not on file  Occupational History   Not on file  Tobacco Use   Smoking status: Never   Smokeless tobacco: Never  Vaping Use   Vaping status: Never Used  Substance and Sexual Activity   Alcohol use: Not Currently    Comment: wine monthly   Drug use: No   Sexual activity: Yes  Other Topics Concern   Not on file  Social History Narrative   Not on file   Social Drivers of Health   Financial Resource Strain: Not on file  Food Insecurity: Not on file  Transportation Needs: Not on file  Physical Activity: Not on file  Stress: Not on file  Social Connections: Not on file  Intimate Partner Violence:  Not on file      12/22/2023    1:43 PM  GAD 7 : Generalized Anxiety Score  Nervous, Anxious, on Edge 1  Control/stop worrying 1  Worry too much - different things 2  Trouble relaxing 1  Restless 0  Easily annoyed or irritable 0  Afraid - awful might happen 2  Total GAD 7 Score 7  Anxiety Difficulty Not difficult at all    Flowsheet Row Office Visit from 12/22/2023 in [facility]  PHQ-9 Total Score 3   Review of Systems  All other systems reviewed and are negative.      Objective    BP 135/85 (BP Location: Left Arm, Patient Position: Sitting, Cuff Size: Normal)   Pulse 78   Temp 98.2 F (36.8 C) (Oral)   Ht 5' 2 (1.575 m)  Wt 157 lb 6.4 oz (71.4 kg)   SpO2 98%   BMI 28.79 kg/m   Physical Exam Constitutional:      General: Lindsay Edwards is awake. Lindsay Edwards is not in acute distress.    Appearance: Normal appearance. Lindsay Edwards is well-developed, well-groomed and overweight. Lindsay Edwards is not ill-appearing, toxic-appearing or diaphoretic.  HENT:     Head: Normocephalic.     Right Ear: Tympanic membrane, ear canal and external ear normal.     Left Ear: Tympanic membrane, ear canal and external ear normal.     Nose: Nose normal. No congestion or rhinorrhea.     Mouth/Throat:     Mouth: Mucous membranes are moist.     Pharynx: Oropharynx is clear.  Eyes:     Extraocular Movements: Extraocular movements intact.     Pupils: Pupils are equal, round, and reactive to light.  Neck:     Vascular: No carotid bruit.  Cardiovascular:     Rate and Rhythm: Normal rate and regular rhythm.     Pulses: Normal pulses.     Heart sounds: Normal heart sounds. No murmur heard.    No friction rub. No gallop.  Pulmonary:     Effort: No respiratory distress.     Breath sounds: No stridor. No wheezing, rhonchi or rales.  Chest:     Chest wall: No tenderness.  Abdominal:     Tenderness: There is no right CVA tenderness or left CVA tenderness.  Musculoskeletal:        General: Normal  range of motion.     Right lower leg: No edema.     Left lower leg: No edema.  Lymphadenopathy:     Cervical: No cervical adenopathy.  Skin:    General: Skin is warm and dry.     Capillary Refill: Capillary refill takes less than 2 seconds.  Neurological:     General: No focal deficit present.     Mental Status: Lindsay Edwards is alert and oriented to person, place, and time. Mental status is at baseline.  Psychiatric:        Mood and Affect: Mood normal.        Behavior: Behavior normal. Behavior is cooperative.        Thought Content: Thought content normal.        Judgment: Judgment normal.         Assessment & Plan:  Assessment and Plan Assessment & Plan Anxiety Chronic anxiety managed with Celexa and Xanax.  -Celexa improved sleep but panic anxiety still happens due to pt's fear of husbands job.  -Discussed alternative medications due to Xanax's long-term risks. - Prescribe trial of Buspar  and hydroxyzine  for alternative to xanax. - Monitor response to new medications and adjust treatment as needed.  Menopausal Symptoms Hot flashes managed with Celexa. Not interested in hormone replacement therapy. Discussed black cohosh as an option for non hormonal. - Continue Celexa.  Elevate Blood Pressure w/o diagnosis of HTN - GOAL<119/79 - Situational anxiety may contribute. - Monitor blood pressure at home and record readings. - Limit sodium intake, recommend 150 minutes of exercise per week, weigh tliss - Discussed the potential need for medication to reduce risks of uncontrolled BP - Follow up in 3 months to reassess.  Gastroesophageal Reflux Disease (GERD) GERD managed with over-the-counter medications. Symptoms infrequent and manageable.  Lipomas Two lipomas causing discomfort on Upper L anterior thigh and LUQ - historically has had multiple lipomas removed . Previous lipomas benign. - Lindsay Edwards would like a referral for removal -  Refer to general surgery for evaluation and  potential removal.  Fatigue - feels more tired recently - will check TSH, CBC, vitamin D , Vitamin b12, A1c  Fam Hx of DMII - will screen a1c  Weight Management Concern about weight gain, especially abdominal. Discussed lifestyle modifications and nutritionist referral. Insurance does not cover newer medications. - Refer to a nutritionist for dietary counseling. - Encourage regular physical activity and dietary modifications.  Need for Shingles - administered today  General Health Maintenance Routine health maintenance discussed. Up to date with Pap smear and Cologuard. Interested in shingles vaccine. - Administer shingles vaccine today. - Order fasting labs for diabetes, cholesterol, thyroid , kidney, liver, anemia, vitamin D , and B12. - Consider Tdap vaccination in future.   Anxiety disorder with panic attacks -     TSH -     hydrOXYzine  HCl; Take 1 tablet (10 mg total) by mouth 3 (three) times daily as needed.  Dispense: 10 tablet; Refill: 0 -     busPIRone  HCl; Take 1 tablet (5 mg total) by mouth 2 (two) times daily.  Dispense: 10 tablet; Refill: 0  Overweight (BMI 25.0-29.9) -     Lipid panel -     TSH -     Amb ref to Medical Nutrition Therapy-MNT  Fatigue, unspecified type -     CBC -     Hemoglobin A1c -     Vitamin B12 -     VITAMIN D  25 Hydroxy (Vit-D Deficiency, Fractures)  Lipoma of torso -     Ambulatory referral to General Surgery  Elevated blood pressure reading without diagnosis of hypertension -     Comprehensive metabolic panel with GFR  Family history of diabetes mellitus (DM)  Encounter for screening for viral disease -     Hepatitis C antibody -     HIV Antibody (routine testing w rflx)  Need for shingles vaccine -     Varicella-zoster vaccine IM  Encounter to establish care with new doctor    Return in about 3 months (around 03/23/2024) for BP check.   Curtis DELENA Boom, FNP  "

## 2023-12-23 ENCOUNTER — Encounter: Payer: Self-pay | Admitting: Family Medicine

## 2023-12-23 MED ORDER — HYDROXYZINE HCL 10 MG PO TABS: 10.0000 mg | ORAL_TABLET | Freq: Three times a day (TID) | ORAL | 0 refills | Status: DC | PRN

## 2023-12-23 MED ORDER — BUSPIRONE HCL 5 MG PO TABS: 5.0000 mg | ORAL_TABLET | Freq: Two times a day (BID) | ORAL | 0 refills | Status: DC

## 2023-12-29 ENCOUNTER — Encounter

## 2023-12-29 ENCOUNTER — Ambulatory Visit: Payer: Self-pay | Admitting: Family Medicine

## 2023-12-29 ENCOUNTER — Other Ambulatory Visit

## 2023-12-29 LAB — LIPID PANEL
Chol/HDL Ratio: 3.5 ratio (ref 0.0–4.4)
Cholesterol, Total: 191 mg/dL (ref 100–199)
HDL: 54 mg/dL (ref >39)
LDL Chol Calc (NIH): 119 mg/dL — ABNORMAL HIGH (ref 0–99)
Triglycerides: 98 mg/dL (ref 0–149)
VLDL Cholesterol Cal: 18 mg/dL (ref 5–40)

## 2023-12-29 LAB — COMPREHENSIVE METABOLIC PANEL WITH GFR
ALT: 10 IU/L (ref 0–32)
AST: 19 IU/L (ref 0–40)
Albumin: 4.3 g/dL (ref 3.8–4.9)
Alkaline Phosphatase: 80 IU/L (ref 44–121)
BUN/Creatinine Ratio: 19 (ref 9–23)
BUN: 13 mg/dL (ref 6–24)
Bilirubin Total: 0.5 mg/dL (ref 0.0–1.2)
CO2: 21 mmol/L (ref 20–29)
Calcium: 9.2 mg/dL (ref 8.7–10.2)
Chloride: 102 mmol/L (ref 96–106)
Creatinine, Ser: 0.69 mg/dL (ref 0.57–1.00)
Globulin, Total: 2.4 g/dL (ref 1.5–4.5)
Glucose: 89 mg/dL (ref 70–99)
Potassium: 4.1 mmol/L (ref 3.5–5.2)
Sodium: 141 mmol/L (ref 134–144)
Total Protein: 6.7 g/dL (ref 6.0–8.5)
eGFR: 104 mL/min/1.73 (ref >59)

## 2023-12-29 LAB — HEPATITIS C ANTIBODY: Hep C Virus Ab: NONREACTIVE

## 2023-12-29 LAB — CBC
Hematocrit: 45.4 % (ref 34.0–46.6)
Hemoglobin: 14.8 g/dL (ref 11.1–15.9)
MCH: 28.2 pg (ref 26.6–33.0)
MCHC: 32.6 g/dL (ref 31.5–35.7)
MCV: 87 fL (ref 79–97)
Platelets: 315 x10E3/uL (ref 150–450)
RBC: 5.25 x10E6/uL (ref 3.77–5.28)
RDW: 12.4 % (ref 11.7–15.4)
WBC: 5.6 x10E3/uL (ref 3.4–10.8)

## 2023-12-29 LAB — VITAMIN D 25 HYDROXY (VIT D DEFICIENCY, FRACTURES): Vit D, 25-Hydroxy: 35.3 ng/mL (ref 30.0–100.0)

## 2023-12-29 LAB — HEMOGLOBIN A1C
Est. average glucose Bld gHb Est-mCnc: 111 mg/dL
Hgb A1c MFr Bld: 5.5 % (ref 4.8–5.6)

## 2023-12-29 LAB — TSH: TSH: 1.72 u[IU]/mL (ref 0.450–4.500)

## 2023-12-29 LAB — HIV ANTIBODY (ROUTINE TESTING W REFLEX): HIV Screen 4th Generation wRfx: NONREACTIVE

## 2023-12-29 LAB — VITAMIN B12: Vitamin B-12: 348 pg/mL (ref 232–1245)

## 2024-01-19 ENCOUNTER — Ambulatory Visit
Admission: RE | Admit: 2024-01-19 | Discharge: 2024-01-19 | Disposition: A | Discharge status: Home / Self Care | Source: Ambulatory Visit | Attending: Obstetrics | Admitting: Obstetrics

## 2024-01-19 DIAGNOSIS — N6489 Other specified disorders of breast: Secondary | ICD-10-CM | POA: Insufficient documentation

## 2024-01-31 ENCOUNTER — Ambulatory Visit: Admitting: Dietician

## 2024-02-02 ENCOUNTER — Ambulatory Visit: Admitting: Surgery

## 2024-02-05 ENCOUNTER — Ambulatory Visit: Admitting: Surgery

## 2024-02-05 ENCOUNTER — Encounter: Payer: Self-pay | Admitting: Surgery

## 2024-02-05 VITALS — BP 165/97 | HR 69 | Ht 62.0 in | Wt 155.0 lb

## 2024-02-05 DIAGNOSIS — D171 Benign lipomatous neoplasm of skin and subcutaneous tissue of trunk: Secondary | ICD-10-CM | POA: Diagnosis not present

## 2024-02-05 DIAGNOSIS — D1724 Benign lipomatous neoplasm of skin and subcutaneous tissue of left leg: Secondary | ICD-10-CM

## 2024-02-05 NOTE — H&P (View-Only) (Signed)
 02/05/2024  Reason for Visit:  Lipomas of left thigh and left abdominal wall  Requesting Provider:  Curtis Boom, FNP  History of Present Illness: Lindsay Edwards is a 53 y.o. female presenting for evaluation of left thigh lipomas x 2 and a left abdominal wall lipoma.  The patient has a history of prior lipomas that have been removed by Dr. Hester as well as Dr. Marolyn in the past.  She reports that recently she has had new lipomas developed in the left thigh which are causing discomfort and tingliness.  She also feels a lipoma in the left abdominal wall which is more tender than the ones in the thigh.  All of these are small but these are causing significant discomfort and she would like this to be excised.  She denies any overlying skin changes, infection, or drainage.  Past Medical History: Past Medical History:  Diagnosis Date   Anxiety    Arthritis    lower back   GERD (gastroesophageal reflux disease)    occ   H. pylori infection 2016   PMS (premenstrual syndrome)    Vertigo    couple mos ago - Dr Herminio fixed the crystals     Past Surgical History: Past Surgical History:  Procedure Laterality Date   BREAST BIOPSY Right 03/16/2022   us  biopsy/ heart clip/ fibroepithelial lesion   BUNIONECTOMY  2018   x2   CESAREAN SECTION  2005   ESOPHAGOGASTRODUODENOSCOPY N/A 10/31/2014   Procedure: ESOPHAGOGASTRODUODENOSCOPY (EGD);  Surgeon: Rogelia Copping, MD;  Location: [facility];  Service: Gastroenterology;  Laterality: N/A;   LAPAROTOMY  2002   MASS EXCISION Left 04/19/2019   Procedure: EXCISION MASS-excision of left chest/torse mass;  Surgeon: Marolyn Nest, MD;  Location: [facility];  Service: General;  Laterality: Left;   TUMOR EXCISION  2011   throat    Home Medications: Prior to Admission medications   Medication Sig Start Date End Date Taking? Authorizing Provider  ALPRAZolam (XANAX) 0.5 MG tablet Take 0.5 mg by mouth daily as needed for anxiety.  11/23/16    [provider]  busPIRone  (BUSPAR ) 5 MG tablet Take 1 tablet (5 mg total) by mouth 2 (two) times daily. 12/23/23   Clifton, Kellie A, FNP  citalopram (CELEXA) 10 MG tablet Take 5 mg by mouth at bedtime.  Patient taking differently: Take 20 mg by mouth at bedtime. 02/05/17   [provider]    Allergies: Allergies  Allergen Reactions   Sulfa Antibiotics Rash    Social History:  reports that she has never smoked. She has never used smokeless tobacco. She reports that she does not currently use alcohol. She reports that she does not use drugs.   Family History: Family History  Problem Relation Age of Onset   Diabetes Mother    Cancer Mother        Kidney   Diabetes Maternal Grandmother    Breast cancer Neg Hx     Review of Systems: Review of Systems  Constitutional:  Negative for chills and fever.  Respiratory:  Negative for shortness of breath.   Cardiovascular:  Negative for chest pain.  Gastrointestinal:  Negative for nausea and vomiting.  Skin:        Pain in left thigh and left abdominal wall due to lipomas    Physical Exam BP (!) 165/97   Pulse 69   Ht 5' 2 (1.575 m)   Wt 155 lb (70.3 kg)   SpO2 99%   BMI 28.35  kg/m  CONSTITUTIONAL: No acute distress HEENT:  Normocephalic, atraumatic, extraocular motion intact. RESPIRATORY:  Normal respiratory effort without pathologic use of accessory muscles. CARDIOVASCULAR: Regular rhythm and rate. MUSCULOSKELETAL:  Normal muscle strength and tone in all four extremities.  No peripheral edema or cyanosis. SKIN: The patient has a 1.5 cm and 2 cm mass in the left thigh anteriorly.  Both area mobile, somewhat tender, without any overlying skin changes or induration.  The patient has a 5 mm mass in the left abdominal wall, left lower quadrant, which is also tender, mobile, without overlying skin changes or induration.  NEUROLOGIC:  Motor and sensation is grossly normal.  Cranial nerves are grossly intact. PSYCH:   Alert and oriented to person, place and time. Affect is normal.  Laboratory Analysis: No results found for this or any previous visit (from the past 24 hours).  Imaging: No results found.  Assessment and Plan: This is a 52 y.o. female with two left thigh lipomas an one left abdominal wall lipoma  -- Discussed with patient the findings on exam today.  The 2 lipomas in the left thigh are larger compared to the one in the abdominal wall.  The ones in the thigh are softer to palpation whereas the abdominal wall lipoma is firmer.  Discussed with her the different types of lipomas and how these can potentially cause more pain or as they grow they can also cause discomfort due to pressure on adjacent tissues.  Discussed with patient that we could certainly proceed with excision of these lipomas and we could do this either in the office under local anesthesia or in the operating room with sedation.  The patient reports that she does have anxiety issues and would rather have this done asleep in the operating room.  We can certainly oblige. - Discussed with patient that the plan for excision of the 2 left thigh lipomas and the left abdominal wall lipoma.  Reviewed the surgery at length with her including the planned incisions, risks of bleeding, infection, injury to surrounding structures, this would be an outpatient procedure, postoperative pain control, activity restrictions, and she is willing to proceed. - We will schedule her for surgery on 02/15/24.  All of her questions have been answered.  I spent 30 minutes dedicated to the care of this patient on the date of this encounter to include pre-visit review of records, face-to-face time with the patient discussing diagnosis and management, and any post-visit coordination of care.   Aloysius Sheree Plant, MD [facility]

## 2024-02-05 NOTE — Progress Notes (Unsigned)
 02/05/2024  Reason for Visit:  Lipomas of left thigh and left abdominal wall  Requesting Provider:  Curtis Boom, FNP  History of Present Illness: Lindsay Edwards is a 53 y.o. female presenting for evaluation of left thigh lipomas x 2 and a left abdominal wall lipoma.  The patient has a history of prior lipomas that have been removed by Dr. Hester as well as Dr. Marolyn in the past.  She reports that recently she has had new lipomas developed in the left thigh which are causing discomfort and tingliness.  She also feels a lipoma in the left abdominal wall which is more tender than the ones in the thigh.  All of these are small but these are causing significant discomfort and she would like this to be excised.  She denies any overlying skin changes, infection, or drainage.  Past Medical History: Past Medical History:  Diagnosis Date   Anxiety    Arthritis    lower back   GERD (gastroesophageal reflux disease)    occ   H. pylori infection 2016   PMS (premenstrual syndrome)    Vertigo    couple mos ago - Dr Herminio fixed the crystals     Past Surgical History: Past Surgical History:  Procedure Laterality Date   BREAST BIOPSY Right 03/16/2022   us  biopsy/ heart clip/ fibroepithelial lesion   BUNIONECTOMY  2018   x2   CESAREAN SECTION  2005   ESOPHAGOGASTRODUODENOSCOPY N/A 10/31/2014   Procedure: ESOPHAGOGASTRODUODENOSCOPY (EGD);  Surgeon: Rogelia Copping, MD;  Location: [facility];  Service: Gastroenterology;  Laterality: N/A;   LAPAROTOMY  2002   MASS EXCISION Left 04/19/2019   Procedure: EXCISION MASS-excision of left chest/torse mass;  Surgeon: Marolyn Nest, MD;  Location: [facility];  Service: General;  Laterality: Left;   TUMOR EXCISION  2011   throat    Home Medications: Prior to Admission medications   Medication Sig Start Date End Date Taking? Authorizing Provider  ALPRAZolam (XANAX) 0.5 MG tablet Take 0.5 mg by mouth daily as needed for anxiety.  11/23/16    [provider]  busPIRone  (BUSPAR ) 5 MG tablet Take 1 tablet (5 mg total) by mouth 2 (two) times daily. 12/23/23   Clifton, Kellie A, FNP  citalopram (CELEXA) 10 MG tablet Take 5 mg by mouth at bedtime.  Patient taking differently: Take 20 mg by mouth at bedtime. 02/05/17   [provider]    Allergies: Allergies  Allergen Reactions   Sulfa Antibiotics Rash    Social History:  reports that she has never smoked. She has never used smokeless tobacco. She reports that she does not currently use alcohol. She reports that she does not use drugs.   Family History: Family History  Problem Relation Age of Onset   Diabetes Mother    Cancer Mother        Kidney   Diabetes Maternal Grandmother    Breast cancer Neg Hx     Review of Systems: Review of Systems  Constitutional:  Negative for chills and fever.  Respiratory:  Negative for shortness of breath.   Cardiovascular:  Negative for chest pain.  Gastrointestinal:  Negative for nausea and vomiting.  Skin:        Pain in left thigh and left abdominal wall due to lipomas    Physical Exam BP (!) 165/97   Pulse 69   Ht 5' 2 (1.575 m)   Wt 155 lb (70.3 kg)   SpO2 99%   BMI 28.35  kg/m  CONSTITUTIONAL: No acute distress HEENT:  Normocephalic, atraumatic, extraocular motion intact. RESPIRATORY:  Normal respiratory effort without pathologic use of accessory muscles. CARDIOVASCULAR: Regular rhythm and rate. MUSCULOSKELETAL:  Normal muscle strength and tone in all four extremities.  No peripheral edema or cyanosis. SKIN: The patient has a 1.5 cm and 2 cm mass in the left thigh anteriorly.  Both area mobile, somewhat tender, without any overlying skin changes or induration.  The patient has a 5 mm mass in the left abdominal wall, left lower quadrant, which is also tender, mobile, without overlying skin changes or induration.  NEUROLOGIC:  Motor and sensation is grossly normal.  Cranial nerves are grossly intact. PSYCH:   Alert and oriented to person, place and time. Affect is normal.  Laboratory Analysis: No results found for this or any previous visit (from the past 24 hours).  Imaging: No results found.  Assessment and Plan: This is a 52 y.o. female with two left thigh lipomas an one left abdominal wall lipoma  -- Discussed with patient the findings on exam today.  The 2 lipomas in the left thigh are larger compared to the one in the abdominal wall.  The ones in the thigh are softer to palpation whereas the abdominal wall lipoma is firmer.  Discussed with her the different types of lipomas and how these can potentially cause more pain or as they grow they can also cause discomfort due to pressure on adjacent tissues.  Discussed with patient that we could certainly proceed with excision of these lipomas and we could do this either in the office under local anesthesia or in the operating room with sedation.  The patient reports that she does have anxiety issues and would rather have this done asleep in the operating room.  We can certainly oblige. - Discussed with patient that the plan for excision of the 2 left thigh lipomas and the left abdominal wall lipoma.  Reviewed the surgery at length with her including the planned incisions, risks of bleeding, infection, injury to surrounding structures, this would be an outpatient procedure, postoperative pain control, activity restrictions, and she is willing to proceed. - We will schedule her for surgery on 02/15/24.  All of her questions have been answered.  I spent 30 minutes dedicated to the care of this patient on the date of this encounter to include pre-visit review of records, face-to-face time with the patient discussing diagnosis and management, and any post-visit coordination of care.   Aloysius Sheree Plant, MD [facility]

## 2024-02-05 NOTE — Patient Instructions (Addendum)
 We have spoken today about removing a Lipoma. This will be done by Dr. Mauri Sous at [facility].  If you are on any injectable weight loss medication, you will need to stop taking your GLP-1 injectable (weight loss) medications 8 days before your surgery to avoid any complications with anesthesia.   You will most likely be able to leave the hospital several hours after your surgery. You may need to have a drain placed after surgery, this depends on the size of your lipoma.  Plan to tentatively be off work for up to 1 week following the surgery.  Please see your Blue surgery sheet for more information. Our surgery scheduler will call you to look at surgery dates and to go over information.   If you have FMLA or Disability paperwork that needs to be filled out, please have your company fax your paperwork to [phone] or you may drop this by either office. This paperwork will be filled out within 3 days after your surgery has been completed.  Lipoma Removal  Lipoma removal is a surgical procedure to remove a lipoma, which is a noncancerous (benign) tumor that is made up of fat cells. Most lipomas are small and painless and do not require treatment. They can form in many areas of the body but are most common under the skin of the back, arms, shoulders, buttocks, and thighs. You may need lipoma removal if you have a lipoma that is large, growing, or causing discomfort. Lipoma removal may also be done for cosmetic reasons. Tell a health care provider about: Any allergies you have. All medicines you are taking, including vitamins, herbs, eye drops, creams, and over-the-counter medicines. Any problems you or family members have had with anesthetic medicines. Any bleeding problems you have. Any surgeries you have had. Any medical conditions you have. Whether you are pregnant or may be pregnant. What are the risks? Generally, this is a safe procedure. However, problems may occur,  including: Infection. Bleeding. Scarring. Allergic reactions to medicines. Damage to nearby structures or organs, such as damage to nerves or blood vessels near the lipoma. What happens before the procedure? Medicines Ask your health care provider about: Changing or stopping your regular medicines. This is especially important if you are taking diabetes medicines or blood thinners. Taking medicines such as aspirin and ibuprofen. These medicines can thin your blood. Do not take these medicines unless your health care provider tells you to take them. Taking over-the-counter medicines, vitamins, herbs, and supplements. General instructions You will have a physical exam. Your health care provider will check the size of the lipoma and whether it can be removed easily. You may have a biopsy and imaging tests, such as X-rays, a CT scan, and an MRI. Do not use any products that contain nicotine or tobacco for at least 4 weeks before the procedure. These products include cigarettes, chewing tobacco, and vaping devices, such as e-cigarettes. If you need help quitting, ask your health care provider. Ask your health care provider: How your surgery site will be marked. What steps will be taken to help prevent infection. These may include: Washing skin with a germ-killing soap. Taking antibiotic medicine. If you will be going home right after the procedure, plan to have a responsible adult: Take you home from the hospital or clinic. You will not be allowed to drive. Care for you for the time you are told. What happens during the procedure?  An IV will be inserted into one of your veins. You  will be given one or more of the following: A medicine to help you relax (sedative). A medicine to numb the area (local anesthetic). A medicine to make you fall asleep (general anesthetic). A medicine that is injected into an area of your body to numb everything below the injection site (regional anesthetic). An  incision will be made into the skin over the lipoma or very near the lipoma. The incision may be made in a natural skin line or crease. Tissues, nerves, and blood vessels near the lipoma will be moved out of the way. The lipoma and the capsule that surrounds it will be separated from the surrounding tissues. The lipoma will be removed. You may have a drain placed depending on the size of your lipoma The incision may be closed with stitches (sutures). A bandage (dressing) will be placed over the incision. The procedure may vary among health care providers and hospitals. What happens after the procedure? Your blood pressure, heart rate, breathing rate, and blood oxygen level will be monitored until you leave the hospital or clinic. If you were prescribed an antibiotic medicine, use it as told by your health care provider. Do not stop using the antibiotic even if you start to feel better. If you were given a sedative during the procedure, it can affect you for several hours. Do not drive or operate machinery until your health care provider says that it is safe. Where to find more information OrthoInfo: orthoinfo.aaos.org Summary Before the procedure, follow instructions from your health care provider about eating and drinking, and changing or stopping your regular medicines. This is especially important if you are taking diabetes medicines or blood thinners. After the lipoma is removed, the incision may be closed with stitches (sutures) and covered with a bandage (dressing). If you were given a sedative during the procedure, it can affect you for several hours. Do not drive or operate machinery until your health care provider says that it is safe.

## 2024-02-06 NOTE — Progress Notes (Signed)
 Patient has been advised of Pre-Admission date/time, and Surgery date at [facility].  Surgery Date: 02/15/24 Preadmission Testing Date: 02/09/24 (phone [id number])  Patient has been made aware to call [phone], between 1-3:00pm the day before surgery, to find out what time to arrive for surgery.

## 2024-02-09 ENCOUNTER — Other Ambulatory Visit: Payer: Self-pay

## 2024-02-09 ENCOUNTER — Encounter
Admission: RE | Admit: 2024-02-09 | Discharge: 2024-02-09 | Disposition: A | Discharge status: Home / Self Care | Source: Ambulatory Visit | Attending: Surgery | Admitting: Surgery

## 2024-02-09 DIAGNOSIS — Z01812 Encounter for preprocedural laboratory examination: Secondary | ICD-10-CM

## 2024-02-09 HISTORY — DX: Benign lipomatous neoplasm of other sites: D17.79

## 2024-02-09 HISTORY — DX: Elevated blood-pressure reading, without diagnosis of hypertension: R03.0

## 2024-02-09 HISTORY — DX: Prediabetes: R73.03

## 2024-02-09 NOTE — Patient Instructions (Addendum)
 Your procedure is scheduled on: 02/15/24 - Thursday Report to the Registration Desk on the 1st floor of the [facility]. To find out your arrival time, please call [phone] between 1PM - 3PM on: 02/14/24 - Wednesday If your arrival time is 6:00 am, do not arrive before that time as the [facility] entrance doors do not open until 6:00 am.  REMEMBER: Instructions that are not followed completely may result in serious medical risk, up to and including death; or upon the discretion of your surgeon and anesthesiologist your surgery may need to be rescheduled.  Do not eat food after midnight the night before surgery.  No gum chewing or hard candies.  You may however, drink CLEAR liquids up to 2 hours before you are scheduled to arrive for your surgery. Do not drink anything within 2 hours of your scheduled arrival time.  Clear liquids include: - water  - Rawson juice without pulp - gatorade (not RED colors) - black coffee or tea (Do NOT add milk or creamers to the coffee or tea) Do NOT drink anything that is not on this list.  One week prior to surgery: Stop Anti-inflammatories (NSAIDS) such as Advil , Aleve, Ibuprofen , Motrin , Naproxen, Naprosyn and Aspirin based products such as Excedrin, Goody's Powder, BC Powder. You may take Tylenol  if needed for pain up until the day of surgery.  Stop ANY OVER THE COUNTER supplements until after surgery.  ON THE DAY OF SURGERY ONLY TAKE THESE MEDICATIONS WITH SIPS OF WATER:  ALPRAZolam (XANAX) if needed   No Alcohol for 24 hours before or after surgery.  No Smoking including e-cigarettes for 24 hours before surgery.  No chewable tobacco products for at least 6 hours before surgery.  No nicotine patches on the day of surgery.  Do not use any recreational drugs for at least a week (preferably 2 weeks) before your surgery.  Please be advised that the combination of cocaine and anesthesia may have negative outcomes, up to and including  death. If you test positive for cocaine, your surgery will be cancelled.  On the morning of surgery brush your teeth with toothpaste and water, you may rinse your mouth with mouthwash if you wish. Do not swallow any toothpaste or mouthwash.  Use CHG Soap or wipes as directed on instruction sheet.  Do not wear jewelry, make-up, hairpins, clips or nail polish.  For welded (permanent) jewelry: bracelets, anklets, waist bands, etc.  Please have this removed prior to surgery.  If it is not removed, there is a chance that hospital personnel will need to cut it off on the day of surgery.  Do not wear lotions, powders, or perfumes.   Do not shave body hair from the neck down 48 hours before surgery.  Contact lenses, hearing aids and dentures may not be worn into surgery.  Do not bring valuables to the hospital. [facility] is not responsible for any missing/lost belongings or valuables.   Notify your doctor if there is any change in your medical condition (cold, fever, infection).  Wear comfortable clothing (specific to your surgery type) to the hospital.  After surgery, you can help prevent lung complications by doing breathing exercises.  Take deep breaths and cough every 1-2 hours. Your doctor may order a device called an Incentive Spirometer to help you take deep breaths.  When coughing or sneezing, hold a pillow firmly against your incision with both hands. This is called "splinting." Doing this helps protect your incision. It also decreases belly  discomfort.  If you are being admitted to the hospital overnight, leave your suitcase in the car. After surgery it may be brought to your room.  In case of increased patient census, it may be necessary for you, the patient, to continue your postoperative care in the Same Day Surgery department.  If you are being discharged the day of surgery, you will not be allowed to drive home. You will need a responsible individual to drive you home and  stay with you for 24 hours after surgery.   If you are taking public transportation, you will need to have a responsible individual with you.  Please call the Pre-admissions Testing Dept. at [phone] if you have any questions about these instructions.  Surgery Visitation Policy:  Patients having surgery or a procedure may have two visitors.  Children under the age of 76 must have an adult with them who is not the patient.  Inpatient Visitation:    Visiting hours are 7 a.m. to 8 p.m. Up to four visitors are allowed at one time in a patient room. The visitors may rotate out with other people during the day.  One visitor age 75 or older may stay with the patient overnight and must be in the room by 8 p.m.   Merchandiser, retail to address health-related social needs:  [email or web address]                                                                                                             Preparing for Surgery with CHLORHEXIDINE  GLUCONATE (CHG) Soap  Chlorhexidine  Gluconate (CHG) Soap  o An antiseptic cleaner that kills germs and bonds with the skin to continue killing germs even after washing  o Used for showering the night before surgery and morning of surgery  Before surgery, you can play an important role by reducing the number of germs on your skin.  CHG (Chlorhexidine  gluconate) soap is an antiseptic cleanser which kills germs and bonds with the skin to continue killing germs even after washing.  Please do not use if you have an allergy  to CHG or antibacterial soaps. If your skin becomes reddened/irritated stop using the CHG.  1. Shower the NIGHT BEFORE SURGERY and the MORNING OF SURGERY with CHG soap.  2. If you choose to wash your hair, wash your hair first as usual with your normal shampoo.  3. After shampooing, rinse your hair and body thoroughly to remove the shampoo.  4. Use CHG as you would any other liquid soap. You can apply CHG  directly to the skin and wash gently with a scrungie or a clean washcloth.  5. Apply the CHG soap to your body only from the neck down. Do not use on open wounds or open sores. Avoid contact with your eyes, ears, mouth, and genitals (private parts). Wash face and genitals (private parts) with your normal soap.  6. Wash thoroughly, paying special attention to the area where your surgery will be performed.  7. Thoroughly rinse your body with warm water.  8.  Do not shower/wash with your normal soap after using and rinsing off the CHG soap.  9. Pat yourself dry with a clean towel.  10. Wear clean pajamas to bed the night before surgery.  12. Place clean sheets on your bed the night of your first shower and do not sleep with pets.  13. Shower again with the CHG soap on the day of surgery prior to arriving at the hospital.  14. Do not apply any deodorants/lotions/powders.  15. Please wear clean clothes to the hospital.

## 2024-02-15 ENCOUNTER — Ambulatory Visit: Admitting: Anesthesiology

## 2024-02-15 ENCOUNTER — Encounter
Admission: RE | Disposition: A | Discharge status: Home / Self Care | Payer: Self-pay | Source: Home / Self Care | Attending: Surgery

## 2024-02-15 ENCOUNTER — Encounter: Payer: Self-pay | Admitting: Surgery

## 2024-02-15 ENCOUNTER — Other Ambulatory Visit: Payer: Self-pay | Admitting: Surgery

## 2024-02-15 ENCOUNTER — Other Ambulatory Visit: Payer: Self-pay

## 2024-02-15 ENCOUNTER — Ambulatory Visit
Admission: RE | Admit: 2024-02-15 | Discharge: 2024-02-15 | Disposition: A | Discharge status: Home / Self Care | Attending: Surgery | Admitting: Surgery

## 2024-02-15 DIAGNOSIS — D171 Benign lipomatous neoplasm of skin and subcutaneous tissue of trunk: Secondary | ICD-10-CM | POA: Insufficient documentation

## 2024-02-15 DIAGNOSIS — D1724 Benign lipomatous neoplasm of skin and subcutaneous tissue of left leg: Secondary | ICD-10-CM | POA: Diagnosis present

## 2024-02-15 DIAGNOSIS — Z01812 Encounter for preprocedural laboratory examination: Secondary | ICD-10-CM

## 2024-02-15 DIAGNOSIS — E119 Type 2 diabetes mellitus without complications: Secondary | ICD-10-CM | POA: Diagnosis not present

## 2024-02-15 DIAGNOSIS — K219 Gastro-esophageal reflux disease without esophagitis: Secondary | ICD-10-CM | POA: Insufficient documentation

## 2024-02-15 HISTORY — PX: EXCISION MASS LOWER EXTREMETIES: SHX6705

## 2024-02-15 HISTORY — PX: EXCISION OF ABDOMINAL WALL TUMOR: SHX6687

## 2024-02-15 LAB — POCT PREGNANCY, URINE: Preg Test, Ur: NEGATIVE

## 2024-02-15 SURGERY — EXCISION MASS LOWER EXTREMITIES
Anesthesia: General | Laterality: Left

## 2024-02-15 MED ORDER — CHLORHEXIDINE GLUCONATE 0.12 % MT SOLN
15.0000 mL | Freq: Once | OROMUCOSAL | Status: AC
  Administered 2024-02-15: 15 mL via OROMUCOSAL

## 2024-02-15 MED ORDER — CHLORHEXIDINE GLUCONATE 0.12 % MT SOLN
OROMUCOSAL | Status: AC
  Filled 2024-02-15: qty 15

## 2024-02-15 MED ORDER — KETOROLAC TROMETHAMINE 30 MG/ML IJ SOLN
INTRAMUSCULAR | Status: AC
  Filled 2024-02-15: qty 1

## 2024-02-15 MED ORDER — PROPOFOL 1000 MG/100ML IV EMUL
INTRAVENOUS | Status: AC
  Filled 2024-02-15: qty 100

## 2024-02-15 MED ORDER — ORAL CARE MOUTH RINSE: 15.0000 mL | Freq: Once | OROMUCOSAL | Status: AC

## 2024-02-15 MED ORDER — GABAPENTIN 300 MG PO CAPS
ORAL_CAPSULE | ORAL | Status: AC
  Filled 2024-02-15: qty 1

## 2024-02-15 MED ORDER — GABAPENTIN 300 MG PO CAPS
300.0000 mg | ORAL_CAPSULE | ORAL | Status: AC
  Administered 2024-02-15: 300 mg via ORAL

## 2024-02-15 MED ORDER — FENTANYL CITRATE (PF) 100 MCG/2ML IJ SOLN
INTRAMUSCULAR | Status: AC
Start: 2024-02-15 — End: 2024-02-15
  Filled 2024-02-15: qty 2

## 2024-02-15 MED ORDER — DEXAMETHASONE SODIUM PHOSPHATE 10 MG/ML IJ SOLN
INTRAMUSCULAR | Status: AC
  Filled 2024-02-15: qty 1

## 2024-02-15 MED ORDER — MIDAZOLAM HCL 2 MG/2ML IJ SOLN
INTRAMUSCULAR | Status: AC
  Filled 2024-02-15: qty 2

## 2024-02-15 MED ORDER — PROPOFOL 10 MG/ML IV BOLUS
INTRAVENOUS | Status: AC
  Filled 2024-02-15: qty 20

## 2024-02-15 MED ORDER — ONDANSETRON HCL 4 MG/2ML IJ SOLN
INTRAMUSCULAR | Status: AC
  Filled 2024-02-15: qty 2

## 2024-02-15 MED ORDER — PROPOFOL 10 MG/ML IV BOLUS
INTRAVENOUS | Status: DC | PRN
  Administered 2024-02-15: 150 mg via INTRAVENOUS

## 2024-02-15 MED ORDER — IBUPROFEN 600 MG PO TABS: 600.0000 mg | ORAL_TABLET | Freq: Three times a day (TID) | ORAL | 0 refills | Status: AC | PRN

## 2024-02-15 MED ORDER — LIDOCAINE HCL (PF) 2 % IJ SOLN
INTRAMUSCULAR | Status: AC
  Filled 2024-02-15: qty 5

## 2024-02-15 MED ORDER — FENTANYL CITRATE (PF) 100 MCG/2ML IJ SOLN
INTRAMUSCULAR | Status: DC | PRN
  Administered 2024-02-15: 50 ug via INTRAVENOUS

## 2024-02-15 MED ORDER — BUPIVACAINE HCL (PF) 0.5 % IJ SOLN
INTRAMUSCULAR | Status: AC
  Filled 2024-02-15: qty 30

## 2024-02-15 MED ORDER — 0.9 % SODIUM CHLORIDE (POUR BTL) OPTIME
TOPICAL | Status: DC | PRN
  Administered 2024-02-15: 1000 mL

## 2024-02-15 MED ORDER — EPHEDRINE SULFATE-NACL 50-0.9 MG/10ML-% IV SOSY
PREFILLED_SYRINGE | INTRAVENOUS | Status: DC | PRN
Start: 2024-02-15 — End: 2024-02-15
  Administered 2024-02-15 (×3): 5 mg via INTRAVENOUS

## 2024-02-15 MED ORDER — DROPERIDOL 2.5 MG/ML IJ SOLN: 0.6250 mg | Freq: Once | INTRAMUSCULAR | Status: DC | PRN

## 2024-02-15 MED ORDER — FENTANYL CITRATE (PF) 100 MCG/2ML IJ SOLN: 25.0000 ug | INTRAMUSCULAR | Status: DC | PRN

## 2024-02-15 MED ORDER — EPHEDRINE 5 MG/ML INJ
INTRAVENOUS | Status: AC
  Filled 2024-02-15: qty 5

## 2024-02-15 MED ORDER — LIDOCAINE HCL (CARDIAC) PF 100 MG/5ML IV SOSY
PREFILLED_SYRINGE | INTRAVENOUS | Status: DC | PRN
  Administered 2024-02-15: 100 mg via INTRAVENOUS

## 2024-02-15 MED ORDER — CHLORHEXIDINE GLUCONATE CLOTH 2 % EX PADS: 6.0000 | MEDICATED_PAD | Freq: Once | CUTANEOUS | Status: DC

## 2024-02-15 MED ORDER — ACETAMINOPHEN 500 MG PO TABS
ORAL_TABLET | ORAL | Status: AC
  Filled 2024-02-15: qty 2

## 2024-02-15 MED ORDER — BUPIVACAINE HCL (PF) 0.5 % IJ SOLN
INTRAMUSCULAR | Status: DC | PRN
Start: 2024-02-15 — End: 2024-02-15
  Administered 2024-02-15: 30 mL

## 2024-02-15 MED ORDER — PHENYLEPHRINE 80 MCG/ML (10ML) SYRINGE FOR IV PUSH (FOR BLOOD PRESSURE SUPPORT)
PREFILLED_SYRINGE | INTRAVENOUS | Status: DC | PRN
  Administered 2024-02-15: 80 ug via INTRAVENOUS
  Administered 2024-02-15: 120 ug via INTRAVENOUS

## 2024-02-15 MED ORDER — CEFAZOLIN SODIUM-DEXTROSE 2-4 GM/100ML-% IV SOLN
2.0000 g | INTRAVENOUS | Status: AC
  Administered 2024-02-15: 2 g via INTRAVENOUS

## 2024-02-15 MED ORDER — DEXAMETHASONE SODIUM PHOSPHATE 10 MG/ML IJ SOLN
INTRAMUSCULAR | Status: DC | PRN
Start: 2024-02-15 — End: 2024-02-15
  Administered 2024-02-15: 10 mg via INTRAVENOUS

## 2024-02-15 MED ORDER — ONDANSETRON HCL 4 MG/2ML IJ SOLN
INTRAMUSCULAR | Status: DC | PRN
  Administered 2024-02-15: 4 mg via INTRAVENOUS

## 2024-02-15 MED ORDER — ACETAMINOPHEN 500 MG PO TABS: 1000.0000 mg | ORAL_TABLET | Freq: Four times a day (QID) | ORAL | Status: AC | PRN

## 2024-02-15 MED ORDER — LACTATED RINGERS IV SOLN: INTRAVENOUS | Status: DC

## 2024-02-15 MED ORDER — KETOROLAC TROMETHAMINE 30 MG/ML IJ SOLN
INTRAMUSCULAR | Status: DC | PRN
Start: 2024-02-15 — End: 2024-02-15
  Administered 2024-02-15: 30 mg via INTRAVENOUS

## 2024-02-15 MED ORDER — CEFAZOLIN SODIUM-DEXTROSE 2-4 GM/100ML-% IV SOLN
INTRAVENOUS | Status: AC
  Filled 2024-02-15: qty 100

## 2024-02-15 MED ORDER — ACETAMINOPHEN 500 MG PO TABS
1000.0000 mg | ORAL_TABLET | ORAL | Status: AC
  Administered 2024-02-15: 1000 mg via ORAL

## 2024-02-15 MED ORDER — CHLORHEXIDINE GLUCONATE CLOTH 2 % EX PADS
6.0000 | MEDICATED_PAD | Freq: Once | CUTANEOUS | Status: AC
  Administered 2024-02-15: 6 via TOPICAL

## 2024-02-15 MED ORDER — MIDAZOLAM HCL 2 MG/2ML IJ SOLN
INTRAMUSCULAR | Status: DC | PRN
  Administered 2024-02-15: 2 mg via INTRAVENOUS

## 2024-02-15 SURGICAL SUPPLY — 23 items
CHLORAPREP W/TINT 26 (MISCELLANEOUS) IMPLANT
DERMABOND ADVANCED .7 DNX12 (GAUZE/BANDAGES/DRESSINGS) ×1 IMPLANT
DRAPE LAPAROTOMY 100X77 ABD (DRAPES) ×1 IMPLANT
DRAPE SHEET LG 3/4 BI-LAMINATE (DRAPES) ×2 IMPLANT
ELECTRODE CAUTERY BLDE TIP 2.5 (TIP) ×1 IMPLANT
ELECTRODE REM PT RTRN 9FT ADLT (ELECTROSURGICAL) ×1 IMPLANT
GAUZE 4X4 16PLY ~~LOC~~+RFID DBL (SPONGE) IMPLANT
GLOVE SURG SYN 7.0 PF PI (GLOVE) ×1 IMPLANT
GLOVE SURG SYN 7.5 PF PI (GLOVE) ×1 IMPLANT
GOWN STRL REUS W/ TWL LRG LVL3 (GOWN DISPOSABLE) ×2 IMPLANT
KIT TURNOVER KIT A (KITS) ×1 IMPLANT
LABEL OR SOLS (LABEL) ×1 IMPLANT
MANIFOLD NEPTUNE II (INSTRUMENTS) ×1 IMPLANT
NDL HYPO 22X1.5 SAFETY MO (MISCELLANEOUS) ×1 IMPLANT
NEEDLE HYPO 22X1.5 SAFETY MO (MISCELLANEOUS) ×1 IMPLANT
NS IRRIG 1000ML POUR BTL (IV SOLUTION) ×1 IMPLANT
PACK BASIN MINOR ARMC (MISCELLANEOUS) ×1 IMPLANT
SUT VIC AB 0 SH 27 (SUTURE) ×2 IMPLANT
SUT VIC AB 3-0 SH 27X BRD (SUTURE) ×2 IMPLANT
SUTURE MNCRL 4-0 27XMF (SUTURE) ×1 IMPLANT
SYR 30ML LL (SYRINGE) ×1 IMPLANT
TRAP FLUID SMOKE EVACUATOR (MISCELLANEOUS) ×1 IMPLANT
WATER STERILE IRR 500ML POUR (IV SOLUTION) ×1 IMPLANT

## 2024-02-15 NOTE — Discharge Instructions (Signed)
 Discharge Instructions: 1.  Patient may shower, but do not scrub wounds heavily and dab dry only. 2.  Do not submerge wounds in pool/tub until fully healed. 3.  Do not apply ointments or hydrogen peroxide to the wounds. 4.  May apply ice packs to the wounds for comfort. 5.  Do not drive while taking narcotics for pain control.  Prior to driving, make sure you are able to rotate right and left to look at blindspots without significant pain or discomfort. 6.  Avoid strenuous activity for the next two weeks.

## 2024-02-15 NOTE — Transfer of Care (Signed)
 Immediate Anesthesia Transfer of Care Note  Patient: Lindsay Edwards  Procedure(s) Performed: EXCISION MASS LOWER EXTREMITIES (Left) EXCISION, NEOPLASM, ABDOMINAL WALL (Left)  Patient Location: PACU  Anesthesia Type:General  Level of Consciousness: drowsy and patient cooperative  Airway & Oxygen Therapy: Patient Spontanous Breathing and Patient connected to face mask oxygen  Post-op Assessment: Report given to RN and Post -op Vital signs reviewed and stable  Post vital signs: stable  Last Vitals:  Vitals Value Taken Time  BP 114/57 02/15/24 08:40  Temp 36.5 C 02/15/24 08:40  Pulse 77 02/15/24 08:45  Resp 13 02/15/24 08:45  SpO2 99 % 02/15/24 08:45  Vitals shown include unfiled device data.  Last Pain:  Vitals:   02/15/24 0840  TempSrc:   PainSc: Asleep         Complications: No notable events documented.

## 2024-02-15 NOTE — Anesthesia Preprocedure Evaluation (Signed)
 Anesthesia Evaluation  Patient identified by MRN, date of birth, ID band Patient awake    Reviewed: Allergy  & Precautions, H&P , NPO status , Patient's Chart, lab work & pertinent test results, reviewed documented beta blocker date and time   History of Anesthesia Complications Negative for: history of anesthetic complications  Airway Mallampati: II  TM Distance: >3 FB Neck ROM: full    Dental  (+) Caps, Dental Advidsory Given, Missing, Teeth Intact   Pulmonary neg pulmonary ROS   Pulmonary exam normal breath sounds clear to auscultation       Cardiovascular Exercise Tolerance: Good negative cardio ROS  Rhythm:regular Rate:Normal     Neuro/Psych negative neurological ROS  negative psych ROS   GI/Hepatic Neg liver ROS,GERD  ,,  Endo/Other  diabetes (prediabetes)    Renal/GU negative Renal ROS  negative genitourinary   Musculoskeletal   Abdominal   Peds  Hematology negative hematology ROS (+)   Anesthesia Other Findings Past Medical History: No date: Anxiety No date: Arthritis     Comment:  lower back No date: Elevated blood pressure reading without diagnosis of  hypertension No date: GERD (gastroesophageal reflux disease)     Comment:  occ 2016: H. pylori infection No date: Lipoma of other specified sites     Comment:  x 2 left thigh No date: PMS (premenstrual syndrome) No date: Pre-diabetes No date: Vertigo     Comment:  couple mos ago - Dr Herminio fixed the crystals   Reproductive/Obstetrics negative OB ROS                              Anesthesia Physical Anesthesia Plan  ASA: 2  Anesthesia Plan: General   Post-op Pain Management:    Induction: Intravenous  PONV Risk Score and Plan: 3 and Ondansetron , Dexamethasone , Midazolam  and Treatment may vary due to age or medical condition  Airway Management Planned: LMA  Additional Equipment:   Intra-op Plan:    Post-operative Plan: Extubation in OR  Informed Consent: I have reviewed the patients History and Physical, chart, labs and discussed the procedure including the risks, benefits and alternatives for the proposed anesthesia with the patient or authorized representative who has indicated his/her understanding and acceptance.     Dental Advisory Given  Plan Discussed with: Anesthesiologist, CRNA and Surgeon  Anesthesia Plan Comments:         Anesthesia Quick Evaluation

## 2024-02-15 NOTE — Op Note (Signed)
  Procedure Date:  02/15/2024  Pre-operative Diagnosis:  Left thigh lipomas x 2 and left abdominal wall lipoma x 1  Post-operative Diagnosis: Left thigh lipomas (1.5 and 2 cm) and left abdominal wall lipoma (2 cm)  Procedure:  Excision of Left thigh lipomas x 2 and left abdominal wall lipoma x 1  Surgeon:  Aloysius Sheree Plant, MD  Anesthesia:  General endotracheal  Estimated Blood Loss:  5 ml  Specimens:   Left lower thigh lipoma Left upper thigh lipoma Left abdominal wall lipoma  Complications:  None  Indications for Procedure:  This is a 53 y.o. female with diagnosis of symptomatic left thigh and abdominal wall lipomas.  The patient wishes to have these excised. The risks of bleeding, abscess or infection, injury to surrounding structures, and need for further procedures were all discussed with the patient and she was willing to proceed.  Description of Procedure: The patient was correctly identified in the preoperative area and brought into the operating room.  The patient was placed supine with VTE prophylaxis in place.  Appropriate time-outs were performed.  Anesthesia was induced and the patient was intubated.  Appropriate antibiotics were infused.  The patient's left thigh and abdominal wall were prepped and draped in usual sterile fashion.  We started with the left thigh.  The lower lipoma was identified and marked.  Local anesthetic was injected intradermally.  A 2.5 cm incision was made over the lipoma, and cautery was used to dissect down the subcutaneous tissue to the lipoma itself.  Skin flaps were created using cautery as well, and then the lipoma was excised using cautery, intact.  It was sent off to pathology.  The cavity was then irrigated and hemostasis was assured with cautery.      We continued to the upper thigh.  The upper lipoma was identified and marked.  Local anesthetic was injected intradermally.  A 3 cm incision was made over the lipoma, and cautery was used to  dissect down the subcutaneous tissue to the lipoma itself.  Skin flaps were created using cautery as well, and then the lipoma was excised using cautery, intact.  It was sent off to pathology.  The cavity was then irrigated and hemostasis was assured with cautery.      We then continued with the left abdominal wall.  The abdominal wall lipoma was identified and marked.  Local anesthetic was injected intradermally.  A 3 cm incision was made over the lipoma, and cautery was used to dissect down the subcutaneous tissue to the lipoma itself.  Skin flaps were created using cautery as well, and then the lipoma was excised using cautery, intact.  It was sent off to pathology.  The cavity was then irrigated and hemostasis was assured with cautery.    Further local anesthetic was injected in all incisions.  The wounds were irrigated with saline and then closed in multiple layers using 3-0 Vicryl and 4-0 Monocryl.  The incisions were cleaned and sealed with DermaBond.  The patient was then emerged from anesthesia, extubated, and brought to the recovery room for further management.    The patient tolerated the procedure well and all counts were correct at the end of the case.   Aloysius Sheree Plant, MD

## 2024-02-15 NOTE — Interval H&P Note (Signed)
 History and Physical Interval Note:  02/15/2024 7:10 AM  Lindsay Edwards  has presented today for surgery, with the diagnosis of left thigh lipoma left abdominal wall lipoma.  The various methods of treatment have been discussed with the patient and family. After consideration of risks, benefits and other options for treatment, the patient has consented to  Procedure(s): EXCISION MASS LOWER EXTREMITIES (Left) EXCISION, NEOPLASM, ABDOMINAL WALL (Left) as a surgical intervention.  The patient's history has been reviewed, patient examined, no change in status, stable for surgery.  I have reviewed the patient's chart and labs.  Questions were answered to the patient's satisfaction.     Aelyn Stanaland

## 2024-02-16 ENCOUNTER — Encounter: Payer: Self-pay | Admitting: Surgery

## 2024-02-16 LAB — SURGICAL PATHOLOGY

## 2024-02-18 ENCOUNTER — Ambulatory Visit: Payer: Self-pay | Admitting: Surgery

## 2024-02-22 NOTE — Anesthesia Postprocedure Evaluation (Signed)
 Anesthesia Post Note  Patient: Lindsay Edwards  Procedure(s) Performed: EXCISION MASS LOWER EXTREMITIES (Left) EXCISION, NEOPLASM, ABDOMINAL WALL (Left)  Patient location during evaluation: PACU Anesthesia Type: General Level of consciousness: awake and alert Pain management: pain level controlled Vital Signs Assessment: post-procedure vital signs reviewed and stable Respiratory status: spontaneous breathing, nonlabored ventilation, respiratory function stable and patient connected to nasal cannula oxygen Cardiovascular status: blood pressure returned to baseline and stable Postop Assessment: no apparent nausea or vomiting Anesthetic complications: no   No notable events documented.   Last Vitals:  Vitals:   02/15/24 0913 02/15/24 0927  BP: 136/84 (!) 152/82  Pulse: 99 98  Resp: 20 16  Temp: 36.6 C 36.6 C  SpO2: 97% 99%    Last Pain:  Vitals:   02/15/24 0927  TempSrc: Temporal  PainSc: 0-No pain                 Prentice Murphy

## 2024-02-28 ENCOUNTER — Ambulatory Visit (INDEPENDENT_AMBULATORY_CARE_PROVIDER_SITE_OTHER): Admitting: Physician Assistant

## 2024-02-28 ENCOUNTER — Encounter: Payer: Self-pay | Admitting: Physician Assistant

## 2024-02-28 VITALS — BP 144/91 | HR 74 | Ht 62.0 in | Wt 158.0 lb

## 2024-02-28 DIAGNOSIS — D171 Benign lipomatous neoplasm of skin and subcutaneous tissue of trunk: Secondary | ICD-10-CM

## 2024-02-28 DIAGNOSIS — Z09 Encounter for follow-up examination after completed treatment for conditions other than malignant neoplasm: Secondary | ICD-10-CM

## 2024-02-28 DIAGNOSIS — D1724 Benign lipomatous neoplasm of skin and subcutaneous tissue of left leg: Secondary | ICD-10-CM

## 2024-02-28 NOTE — Progress Notes (Signed)
 [facility] POST-OP OFFICE VISIT  02/28/2024  HPI: Lindsay Edwards is a 53 y.o. female 13 days s/p excision of left thigh lipomas x2 and left abdominal wall lipoma with Dr Desiderio   She is doing well She had an increase in pain on POD3 but this improved with ice and ibuprofen  No fever, chills No swelling or erythema, no drainage No other complaints   Vital signs: BP (!) 144/91   Pulse 74   Ht 5' 2 (1.575 m)   Wt 158 lb (71.7 kg)   SpO2 98%   BMI 28.90 kg/m    Physical Exam: Constitutional: Well appearing female, NAD Skin: Incisions x2 to the left thigh and incision to the left abdominal wall, healing well, no erythema, no swelling, no drainage   Assessment/Plan: This is a 53 y.o. female 13 days s/p excision of left thigh lipomas x2 and left abdominal wall lipoma with Dr Desiderio    - Pain control prn  - Reviewed wound care recommendation  - Reviewed surgical pathology; Lipoma x3  - She can follow up on as needed basis; She understands to call with questions/concerns  -- Arthea Platt, PA-C [facility] 02/28/2024, 3:21 PM M-F: 7am - 4pm

## 2024-02-28 NOTE — Patient Instructions (Signed)
 How to Care for Your Wound Closed with Skin Glue  Some cuts, wounds, and cuts from surgery can be closed with skin glue. Skin glue holds the skin together and helps your wound heal faster. The glue sticks in about 1 minute and is fully strong in 2-3 minutes. The glue will go away on its own as your wound heals. Follow these instructions at home: Wound care  If a bandage was put on the wound, keep it clean and dry. Take care of your wound as told. Make sure you: Wash your hands with soap and water for at least 20 seconds before and after you change your bandage. If you can't use soap and water, use hand sanitizer. Change your bandage. Leave skin glue in place. It will come off on its own after 7-10 days. Do not scratch, rub, or pick at the glue. Do not put tape over the glue. The glue could come off the wound when you pull the tape off. Protect the wound from getting hurt until it's healed. Check your wound area every day for signs of infection. Check for: More redness, swelling, or pain. More fluid or blood. Warmth. Pus or a bad smell. Check for a rash or hardness at the wound site. Check to see if your wound reopens. Bathing Do not take baths, swim, or use a hot tub until you're told it's OK. Ask if you can shower. You can usually shower after the first 24 hours. Cover the bandage when you take a shower. Use a cover that doesn't let any water in. Do not soak the area where there is skin glue.. Do not use soaps or put creams on your wound. Certain ointments can weaken the glue. Eating and drinking Eat a diet that includes protein, vitamin A, and vitamin C to help the wound heal. Drink more fluids as told. General instructions Cover your wound with clothes or put sunscreen on when you are outside. Use sunscreen with at least 30 SPF. This may help reduce scars. Take your medicines only as told. Do not smoke, vape, or use nicotine or tobacco. Doing this can slow down healing. Contact a  health care provider if: The tissue glue becomes soaked with blood or falls off before your wound has healed. The glue may need to be replaced. You have a fever or chills. You have any signs of infection. You develop a rash after the glue is put on. Your wound breaks open. Get help right away if: You have very bad swelling, red streaks, or more pain around your wound. This information is not intended to replace advice given to you by your health care provider. Make sure you discuss any questions you have with your health care provider. Document Revised: 08/24/2023 Document Reviewed: 08/24/2023 Elsevier Patient Education  2025 [employer].

## 2024-03-22 ENCOUNTER — Ambulatory Visit: Admitting: Family Medicine

## 2024-03-29 ENCOUNTER — Ambulatory Visit: Admitting: Family Medicine

## 2024-04-05 ENCOUNTER — Ambulatory Visit: Admitting: Family Medicine

## 2024-04-05 ENCOUNTER — Encounter: Payer: Self-pay | Admitting: Family Medicine

## 2024-04-05 VITALS — BP 137/75 | HR 79 | Ht 62.0 in | Wt 163.4 lb

## 2024-04-05 DIAGNOSIS — Z23 Encounter for immunization: Secondary | ICD-10-CM | POA: Diagnosis not present

## 2024-04-05 DIAGNOSIS — E7800 Pure hypercholesterolemia, unspecified: Secondary | ICD-10-CM | POA: Diagnosis not present

## 2024-04-05 DIAGNOSIS — F411 Generalized anxiety disorder: Secondary | ICD-10-CM

## 2024-04-05 DIAGNOSIS — F419 Anxiety disorder, unspecified: Secondary | ICD-10-CM | POA: Diagnosis not present

## 2024-04-05 DIAGNOSIS — R030 Elevated blood-pressure reading, without diagnosis of hypertension: Secondary | ICD-10-CM

## 2024-04-05 MED ORDER — GABAPENTIN 100 MG PO CAPS: 100.0000 mg | ORAL_CAPSULE | Freq: Three times a day (TID) | ORAL | 0 refills | Status: AC | PRN

## 2024-04-05 NOTE — Progress Notes (Signed)
 Established Patient Office Visit  Introduced to nurse practitioner role and practice setting.  All questions answered.  Discussed provider/patient relationship and expectations.   Subjective   Patient ID: Lindsay Edwards, female    DOB: 1970-06-20  Age: 53 y.o. MRN: [id number]  Chief Complaint  Patient presents with   Medical Management of Chronic Issues    Follow-up anxiety and second shingles vaccine.    Discussed the use of AI scribe software for clinical note transcription with the patient, who gave verbal consent to proceed.  History of Present Illness Lindsay Edwards is a 53 year old female present follow up on blood pressure management, hx of anxiety.   Mentions she is not the best at checking her BP at work, couples times she has, Sbp low 130s. She attributes some of her blood pressure issues to anxiety and has been increasing her physical activity by walking more.  She has a history of anxiety and is currently taking Celexa nightly. She uses Xanax as needed, approximately once or twice a week, particularly when feeling 'frazzled' or having difficulty sleeping. She has tried hydroxyzine  and buspar  medication but found them less effective, with hydroxyzine  causing sleepiness, and buspar  making her feel off.. Her OB GYN manages her prescriptions for Celexa and Xanax.  She recently had multiple lipomas removed, which were confirmed after removal  as benign fatty tissue. The recovery process has been smooth. She has had lipomas in the past, including one removed near her trachea a few years back.  Does have HTN in family.       04/05/2024    1:00 PM 12/22/2023    1:42 PM 03/08/2019    1:49 PM  Depression screen PHQ 2/9  Decreased Interest 0 0 0  Down, Depressed, Hopeless 0 0 0  PHQ - 2 Score 0 0 0  Altered sleeping  0   Tired, decreased energy  3   Change in appetite  0   Feeling bad or failure about yourself   0   Trouble concentrating  0   Moving slowly or  fidgety/restless  0   Suicidal thoughts  0   PHQ-9 Score  3   Difficult doing work/chores  Not difficult at all        04/05/2024    1:00 PM 12/22/2023    1:43 PM  GAD 7 : Generalized Anxiety Score  Nervous, Anxious, on Edge 0 1  Control/stop worrying 0 1  Worry too much - different things 0 2  Trouble relaxing 0 1  Restless 0 0  Easily annoyed or irritable 0 0  Afraid - awful might happen 0 2  Total GAD 7 Score 0 7  Anxiety Difficulty Not difficult at all Not difficult at all     ROS  Negative unless indicated in HPI   Objective:     BP 137/75 (BP Location: Left Arm, Patient Position: Sitting, Cuff Size: Normal)   Pulse 79   Ht 5' 2 (1.575 m)   Wt 163 lb 6.4 oz (74.1 kg)   SpO2 98%   BMI 29.89 kg/m    Physical Exam Constitutional:      General: She is not in acute distress.    Appearance: Normal appearance. She is not ill-appearing, toxic-appearing or diaphoretic.  HENT:     Head: Normocephalic.     Nose: Nose normal.     Mouth/Throat:     Mouth: Mucous membranes are moist.     Pharynx: Oropharynx is  clear.  Eyes:     Extraocular Movements: Extraocular movements intact.     Pupils: Pupils are equal, round, and reactive to light.  Cardiovascular:     Rate and Rhythm: Normal rate and regular rhythm.     Pulses: Normal pulses.     Heart sounds: Normal heart sounds. No murmur heard.    No friction rub. No gallop.  Pulmonary:     Effort: No respiratory distress.     Breath sounds: No stridor. No wheezing, rhonchi or rales.  Chest:     Chest wall: No tenderness.  Musculoskeletal:     Right lower leg: No edema.     Left lower leg: No edema.  Skin:    General: Skin is warm and dry.     Capillary Refill: Capillary refill takes less than 2 seconds.  Neurological:     General: No focal deficit present.     Mental Status: She is alert and oriented to person, place, and time. Mental status is at baseline.  Psychiatric:        Mood and Affect: Mood normal.         Behavior: Behavior normal.        Thought Content: Thought content normal.        Judgment: Judgment normal.      No results found for any visits on 04/05/24.    The 10-year ASCVD risk score (Arnett DK, et al., 2019) is: 1.6%    Assessment & Plan:  Elevated blood pressure reading without diagnosis of hypertension  GAD (generalized anxiety disorder) -     Gabapentin ; Take 1 capsule (100 mg total) by mouth 3 (three) times daily as needed. For anxiety  Dispense: 90 capsule; Refill: 0  Anxiety disorder with panic attacks  Elevated LDL cholesterol level  Immunization due -     Varicella-zoster vaccine IM     Assessment and Plan Assessment & Plan Elevated Blood Pressure w/o diag of HTN - DBP have improve dsince previous visit, SBP in 130s - long discussed and shared decision making, close monitoring at home, consistently of BP with automatic upper arm cuff - VERY nervous and hesistent to take BP meds - stressed lifestyle mgmt, but genetics can play role, and ultimately goal is to have control and reduce risk, - okay to continue life style mgmt, as DBP has improvement - close follow up in 3 months.  GOAL minimally SBP<130 and DBP<80, ultimately <120/80  - Discussed the importance of regular monitoring and potential risks of untreated hypertension, including end organ damage.  - Emphasized lifestyle modifications and potential need for medication if blood pressure remains consistently above 130/80 mmHg. - Monitor blood pressure three times a week at the same time of day - Continue lifestyle modifications including increased physical activity and dietary changes, DASH diet, lean prteins,  limiting processed fatty, sugary foods. - Reassess blood pressure in three months.  Elevated LDL Lipid panel shows total cholesterol within normal limits but LDL is elevated at 119 mg/dL. ASCVD risk - 1.6%, indicating lifestyle management is appropriate. - Implement lifestyle modifications to  reduce cholesterol, including limiting red meat and processed foods.  The 10-year ASCVD risk score (Arnett DK, et al., 2019) is: 1.6%   Values used to calculate the score:     Age: 15 years     Clincally relevant sex: Female     Is Non-Hispanic African American: No     Diabetic: No     Tobacco smoker: No  Systolic Blood Pressure: 137 mmHg     Is BP treated: No     HDL Cholesterol: 54 mg/dL     Total Cholesterol: 191 mg/dL   GAD with panic attacks Currently taking Celexa 20mg   nightly and Xanax 0.25 to 0.5mg  as needed daily, approximately once or twice a week. Tried hydroxyzine  and buspar  - poor side effects.  - Discussed gabapentin  as an alternative for anxiety management. - Emphasized long-term risks of benzodiazepines and potential need for behavioral health consultation if anxiety persists. - Prescribe gabapentin  100mg  as an alternative to Xanax for anxiety management. - Continue Celexa and xanax as prescribed by OB/GYN provider. - Monitor anxiety symptoms and consider behavioral health consultation if needed.  Immunization Due - administer second Shingles today   Return in about 3 months (around 07/06/2024).   I, Curtis DELENA Boom, FNP, have reviewed all documentation for this visit. The documentation on 04/05/24 for the exam, diagnosis, procedures, and orders are all accurate and complete.  Curtis DELENA Boom, FNP
